# Patient Record
Sex: Female | Born: 2000 | Race: White | Hispanic: No | Marital: Single | Smoking: Never smoker
Health system: Southern US, Community
[De-identification: ages and names within clinical notes are randomized; demographics above are authoritative.]

## PROBLEM LIST (undated history)

## (undated) DIAGNOSIS — F329 Major depressive disorder, single episode, unspecified: Secondary | ICD-10-CM

## (undated) DIAGNOSIS — R45851 Suicidal ideations: Secondary | ICD-10-CM

## (undated) DIAGNOSIS — F909 Attention-deficit hyperactivity disorder, unspecified type: Secondary | ICD-10-CM

## (undated) DIAGNOSIS — F32A Depression, unspecified: Secondary | ICD-10-CM

## (undated) DIAGNOSIS — F3181 Bipolar II disorder: Secondary | ICD-10-CM

## (undated) HISTORY — DX: Bipolar II disorder: F31.81

---
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
## 2018-04-25 ENCOUNTER — Ambulatory Visit (INDEPENDENT_AMBULATORY_CARE_PROVIDER_SITE_OTHER): Payer: Managed Care, Other (non HMO) | Admitting: Physician Assistant

## 2018-04-25 ENCOUNTER — Other Ambulatory Visit: Payer: Self-pay

## 2018-04-25 ENCOUNTER — Encounter: Payer: Self-pay | Admitting: Physician Assistant

## 2018-04-25 VITALS — BP 110/70 | HR 91 | Temp 99.0°F | Resp 18 | Ht 65.41 in | Wt 217.6 lb

## 2018-04-25 DIAGNOSIS — R5383 Other fatigue: Secondary | ICD-10-CM

## 2018-04-25 DIAGNOSIS — R7989 Other specified abnormal findings of blood chemistry: Secondary | ICD-10-CM

## 2018-04-25 NOTE — Progress Notes (Signed)
Cassandra Obrien  MRN: [id number] DOB: 2001/06/30  PCP: Morrell RiddleWeber, Sarabella Caprio L, PA-C  Chief Complaint  Patient presents with  . Thyroid Problem    discuss thyroid     Subjective:  Pt presents to clinic for concerns that she is has had increased fatigue without change in her daily habits or lifestyle.  She has tried caffeine and it is not helping.  TSH 4.760 on 5/31 with psychiatrist - checked due to medications and symptoms - he started her on Synrthroid 50mcg and she has taken it for the last week that she wants to discuss but this needs for her.  History is obtained by patient.  Review of Systems  Constitutional: Positive for fatigue.    Patient Active Problem List   Diagnosis Date Noted  . Bipolar disorder (HCC) 12/22/2014  . Obesity, Class II, BMI 35-39.9 11/27/2012    Current Outpatient Medications on File Prior to Visit  Medication Sig Dispense Refill  . ALPRAZolam (XANAX) 0.5 MG tablet TAKE 1 TABLET BY MOUTH TWICE A DAY AS NEEDED FOR ANXIETY    . escitalopram (LEXAPRO) 20 MG tablet Take 1 tablet (20 mg total) by mouth at bedtime. 30 tablet 5  . levonorgestrel (MIRENA, 52 MG,) 20 MCG/24HR IUD by Intrauterine route.    Marland Kitchen. levothyroxine (SYNTHROID, LEVOTHROID) 50 MCG tablet TAKE ONE TABLET BY MOUTH DAILY ON EMPTY STOMACH  3  . ARIPiprazole (ABILIFY) 20 MG tablet Take 20 mg by mouth daily.  1  . lamoTRIgine (LAMICTAL) 150 MG tablet TAKE 2 TABLETS BY MOUTH DAILY. IF OUT FOR MORE THAN 1 WEEK, DON'T RESTART AND CALL MD  1  . lithium carbonate (LITHOBID) 300 MG CR tablet TAKE 2 TABLETS BY MOUTH AT BEDTIME FOR 4 DAYS, THEN 3 TABLETS BY MOUTH AT BEDTIME  1   No current facility-administered medications on file prior to visit.     Allergies  Allergen Reactions  . Prednisone Other (See Comments)    Trouble walking     Past Medical History:  Diagnosis Date  . ADHD (attention deficit hyperactivity disorder)   . Bipolar 2 disorder (HCC)   . Depression   . Suicidal ideations     Social History   Social History Narrative   Lives with her maternal grandparents and her mother in [place], [place]   Her younger half-siblings live with her father and step-mother in [place], [place].   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   Social History   Tobacco Use  . Smoking status: Never Smoker  . Smokeless tobacco: Never Used  Substance Use Topics  . Alcohol use: No    Alcohol/week: 0.0 oz  . Drug use: No   family history includes ADD / ADHD in her father; Bipolar disorder in her mother; Diabetes in her maternal grandmother; Heart disease in her maternal grandfather.     Objective:  BP 110/70   Pulse 91   Temp 99 F (37.2 C) (Oral)   Resp 18   Ht 5' 5.41" (1.661 m)   Wt 217 lb 9.6 oz (98.7 kg)   SpO2 98%   BMI 35.76 kg/m  Body mass index is 35.76 kg/m.  Wt Readings from Last 3 Encounters:  04/25/18 217 lb 9.6 oz (98.7 kg) (99 %, Z= 2.18)*  03/30/17 218 lb (98.9 kg) (99 %, Z= 2.19)*  02/28/17 220 lb (99.8 kg) (99 %,  Z= 2.21)*   * Growth percentiles are based on CDC (Girls, 2-20 Years) data.    Physical Exam  Constitutional: She is oriented to person, place, and time. She appears well-developed and well-nourished.  HENT:  Head: Normocephalic and atraumatic.  Right Ear: Hearing and external ear normal.  Left Ear: Hearing and external ear normal.  Eyes: Conjunctivae are normal.  Neck: Normal range of motion. No thyroid mass and no thyromegaly present.  Cardiovascular: Normal rate, regular rhythm and normal heart sounds.  No murmur heard. Pulmonary/Chest: Effort normal and breath sounds normal. She has no wheezes.  Neurological: She is alert and oriented to person, place, and time.  Skin: Skin is warm and dry.  Psychiatric: Judgment normal.  Vitals reviewed.   Assessment and Plan :  Elevated TSH - Plan: T4, Free patient has been  on increase of Synthroid but still want to do a free T4 as this should not be affected.  Unsure if her mildly elevated TSH is related to hypothyroidism or perhaps a medication interaction.  Discussed this with patient.  Patient verbalized to me that they understand the following: diagnosis, what is being done for them, what to expect and what should be done at home.  Their questions have been answered.  See after visit summary for patient specific instructions.  Benny Lennert PA-C  Primary Care at [facility] 04/25/2018 2:53 PM  Please note: Portions of this report may have been transcribed using dragon voice recognition software. Every effort was made to ensure accuracy; however, inadvertent computerized transcription errors may be present.

## 2018-04-25 NOTE — Patient Instructions (Signed)
     IF you received an x-ray today, you will receive an invoice from [facility]. Please contact [facility] at [phone] with questions or concerns regarding your invoice.   IF you received labwork today, you will receive an invoice from [facility]. Please contact [facility] at [phone] with questions or concerns regarding your invoice.   Our billing staff will not be able to assist you with questions regarding bills from these companies.  You will be contacted with the lab results as soon as they are available. The fastest way to get your results is to activate your My Chart account. Instructions are located on the last page of this paperwork. If you have not heard from us regarding the results in 2 weeks, please contact this office.     

## 2018-04-26 LAB — T4, FREE: FREE T4: 1.44 ng/dL (ref 0.93–1.60)

## 2018-05-31 ENCOUNTER — Encounter: Payer: Self-pay | Admitting: Physician Assistant

## 2018-05-31 ENCOUNTER — Other Ambulatory Visit: Payer: Self-pay

## 2018-05-31 ENCOUNTER — Ambulatory Visit (INDEPENDENT_AMBULATORY_CARE_PROVIDER_SITE_OTHER): Payer: Managed Care, Other (non HMO) | Admitting: Physician Assistant

## 2018-05-31 VITALS — BP 114/70 | HR 79 | Temp 98.6°F | Resp 20 | Ht 65.63 in | Wt 221.0 lb

## 2018-05-31 DIAGNOSIS — R000 Tachycardia, unspecified: Secondary | ICD-10-CM | POA: Diagnosis not present

## 2018-05-31 DIAGNOSIS — R7989 Other specified abnormal findings of blood chemistry: Secondary | ICD-10-CM | POA: Diagnosis not present

## 2018-05-31 NOTE — Progress Notes (Signed)
Cassandra MoulderMolly Obrien  MRN: [id number] DOB: September 23, 2001  PCP: Morrell RiddleWeber, Sarah L, PA-C  Chief Complaint  Patient presents with  . Medcation Follow Up/Labs     synthroid  . Irregular Heart Beat    pt states about 3 days    Subjective:  Pt presents to clinic for check on her thyroid.  She has been on synthroid 50mcg for about 6 weeks that was started with her psychiatrist due to low TSH and fatigue.  She started with a manic episode a week or so ago and started to feel bad and that was when she noticed her heart rate being fast or slow but not irregular.  The mania has almost resolved at this time.  She is working with a psychiatrist for her mood and it is mostly regulated.   She is very worried about her heart rate change.  She does not believe she has anxiety which leads to the elevated heart rate.  She states during the time that her heart rate is elevated she feels poorly.  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  She has had to use Xanax 2x/month due to mania and HR issues that were happening at the same time  History is obtained by patient.  Review of Systems  Constitutional: Negative for chills and fever.  HENT: Negative.   Cardiovascular: Positive for palpitations. Negative for leg swelling.  Psychiatric/Behavioral: Positive for dysphoric mood.    Patient Active Problem List   Diagnosis Date Noted  . Bipolar disorder (HCC) 12/22/2014  . MDD (major depressive disorder), recurrent severe, without psychosis (HCC) 12/22/2014  . Obesity, Class II, BMI 35-39.9 11/27/2012    Current Outpatient Medications on File Prior to Visit  Medication Sig Dispense Refill  . ALPRAZolam (XANAX) 0.5 MG tablet TAKE 1 TABLET BY MOUTH TWICE A DAY AS NEEDED FOR ANXIETY    . ARIPiprazole (ABILIFY) 20 MG tablet Take 20 mg by mouth daily.  1  . lamoTRIgine (LAMICTAL) 150 MG tablet TAKE 2 TABLETS BY MOUTH DAILY. IF OUT FOR MORE THAN 1 WEEK, DON'T  RESTART AND CALL MD  1  . levonorgestrel (MIRENA, 52 MG,) 20 MCG/24HR IUD by Intrauterine route.    Marland Kitchen. levothyroxine (SYNTHROID, LEVOTHROID) 50 MCG tablet TAKE ONE TABLET BY MOUTH DAILY ON EMPTY STOMACH  3  . lithium carbonate (LITHOBID) 300 MG CR tablet TAKE 2 TABLETS BY MOUTH AT BEDTIME FOR 4 DAYS, THEN 3 TABLETS BY MOUTH AT BEDTIME  1   No current facility-administered medications on file prior to visit.     Allergies  Allergen Reactions  . Prednisone Other (See Comments)    Trouble walking     Past Medical History:  Diagnosis Date  . ADHD (attention deficit hyperactivity disorder)   . Bipolar 2 disorder (HCC)   . Depression   . Suicidal ideations    Social History   Social History Narrative   Lives with her maternal grandparents and her mother in [place], [place]).   Her younger half-siblings live with her father and step-mother in [place], [place].   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   Social History   Tobacco Use  . Smoking status: Never Smoker  . Smokeless tobacco: Never Used  Substance Use Topics  . Alcohol use: No    Alcohol/week:  0.0 standard drinks  . Drug use: No   family history includes ADD / ADHD in her father; Bipolar disorder in her mother; Diabetes in her maternal grandmother; Heart disease in her maternal grandfather.     Objective:  BP 114/70 (BP Location: Right Arm, Patient Position: Sitting, Cuff Size: Large)   Pulse 79   Temp 98.6 F (37 C) (Oral)   Resp 20   Ht 5' 5.63" (1.667 m)   Wt 221 lb (100.2 kg)   SpO2 97%   BMI 36.07 kg/m  Body mass index is 36.07 kg/m.  Wt Readings from Last 3 Encounters:  05/31/18 221 lb (100.2 kg) (99 %, Z= 2.21)*  04/25/18 217 lb 9.6 oz (98.7 kg) (99 %, Z= 2.18)*  03/30/17 218 lb (98.9 kg) (99 %, Z= 2.19)*   * Growth percentiles are based on CDC (Girls, 2-20 Years) data.     Physical Exam  Constitutional: She is oriented to person, place, and time. She appears well-developed and well-nourished.  HENT:  Head: Normocephalic and atraumatic.  Right Ear: Hearing and external ear normal.  Left Ear: Hearing and external ear normal.  Eyes: Conjunctivae are normal.  Neck: Normal range of motion.  Cardiovascular: Normal rate, regular rhythm and normal heart sounds.  No murmur heard. Pulmonary/Chest: Effort normal and breath sounds normal. She has no wheezes.  Neurological: She is alert and oriented to person, place, and time.  Skin: Skin is warm and dry.  Psychiatric: Her behavior is normal. Judgment and thought content normal.  Mild anxiety, seems more frustrated.  Vitals reviewed.    Rhythm: sinus rythym at a rate of 73. Findings: NSR without acute changes Last EKG: none  Changes from last EKG: n/a I have personally reviewed the EKG tracing and agree with the computerized printout.   Assessment and Plan :  Tachycardia - Plan: EKG 12-Lead, Basic metabolic panel  Elevated TSH - Plan: TSH, T4, Free -  patient has been on Synthroid 50 mg for about 6 weeks.  Recheck labs today concerned that patient may be hyperthyroid due to medication dose causing her symptoms.  If her TSH is normal consider referral to cardiology for possible Holter monitor.  Patient verbalized to me that they understand the following: diagnosis, what is being done for them, what to expect and what should be done at home.  Their questions have been answered.  See after visit summary for patient specific instructions.  Benny LennertSarah Weber PA-C  Primary Care at [facility] 06/01/2018 6:35 PM  Please note: Portions of this report may have been transcribed using dragon voice recognition software. Every effort was made to ensure accuracy; however, inadvertent computerized transcription errors may be present.

## 2018-05-31 NOTE — Patient Instructions (Signed)
  I will contact you with your lab results within the next 2 weeks.  If you have not heard from us then please contact us. The fastest way to get your results is to register for My Chart.   IF you received an x-ray today, you will receive an invoice from [facility]. Please contact [facility] at [phone] with questions or concerns regarding your invoice.   IF you received labwork today, you will receive an invoice from [facility]. Please contact [facility] at [phone] with questions or concerns regarding your invoice.   Our billing staff will not be able to assist you with questions regarding bills from these companies.  You will be contacted with the lab results as soon as they are available. The fastest way to get your results is to activate your My Chart account. Instructions are located on the last page of this paperwork. If you have not heard from us regarding the results in 2 weeks, please contact this office.     

## 2018-06-01 ENCOUNTER — Encounter: Payer: Self-pay | Admitting: Physician Assistant

## 2018-06-01 LAB — T4, FREE: Free T4: 1.3 ng/dL (ref 0.93–1.60)

## 2018-06-01 LAB — BASIC METABOLIC PANEL
BUN / CREAT RATIO: 12 (ref 9–23)
BUN: 8 mg/dL (ref 6–20)
CO2: 25 mmol/L (ref 20–29)
CREATININE: 0.68 mg/dL (ref 0.57–1.00)
Calcium: 9.4 mg/dL (ref 8.7–10.2)
Chloride: 101 mmol/L (ref 96–106)
GFR calc Af Amer: 147 mL/min/{1.73_m2} (ref >59)
GFR, EST NON AFRICAN AMERICAN: 127 mL/min/{1.73_m2} (ref >59)
Glucose: 83 mg/dL (ref 65–99)
Potassium: 4.8 mmol/L (ref 3.5–5.2)
SODIUM: 140 mmol/L (ref 134–144)

## 2018-06-01 LAB — TSH: TSH: 1.02 u[IU]/mL (ref 0.450–4.500)

## 2018-06-03 ENCOUNTER — Encounter: Payer: Self-pay | Admitting: Physician Assistant

## 2018-06-03 DIAGNOSIS — R000 Tachycardia, unspecified: Secondary | ICD-10-CM

## 2018-06-07 ENCOUNTER — Encounter: Payer: Self-pay | Admitting: Family Medicine

## 2018-06-15 ENCOUNTER — Ambulatory Visit: Payer: Managed Care, Other (non HMO) | Admitting: Physician Assistant

## 2018-06-21 ENCOUNTER — Telehealth: Payer: Self-pay | Admitting: Family Medicine

## 2018-06-21 NOTE — Telephone Encounter (Signed)
Left a VM in regards to her appt she has with Dr. Clelia Croft on 07/02/2018. The provider is taking a leave of absence for the month of Sept. We can reschedule with a different provider or schedule with Dr. Clelia Croft in Oct.

## 2018-06-22 ENCOUNTER — Ambulatory Visit: Payer: Managed Care, Other (non HMO) | Admitting: Physician Assistant

## 2018-07-02 ENCOUNTER — Ambulatory Visit: Payer: Managed Care, Other (non HMO) | Admitting: Family Medicine

## 2018-08-02 ENCOUNTER — Encounter: Payer: Self-pay | Admitting: Physician Assistant

## 2018-08-02 ENCOUNTER — Other Ambulatory Visit: Payer: Self-pay

## 2018-08-02 ENCOUNTER — Ambulatory Visit (INDEPENDENT_AMBULATORY_CARE_PROVIDER_SITE_OTHER): Payer: Managed Care, Other (non HMO) | Admitting: Physician Assistant

## 2018-08-02 VITALS — BP 121/74 | HR 91 | Temp 98.2°F | Resp 16 | Ht 64.57 in | Wt 219.0 lb

## 2018-08-02 DIAGNOSIS — M2550 Pain in unspecified joint: Secondary | ICD-10-CM | POA: Diagnosis not present

## 2018-08-02 DIAGNOSIS — R5383 Other fatigue: Secondary | ICD-10-CM

## 2018-08-02 DIAGNOSIS — Z23 Encounter for immunization: Secondary | ICD-10-CM | POA: Diagnosis not present

## 2018-08-02 DIAGNOSIS — L659 Nonscarring hair loss, unspecified: Secondary | ICD-10-CM | POA: Diagnosis not present

## 2018-08-02 DIAGNOSIS — F3175 Bipolar disorder, in partial remission, most recent episode depressed: Secondary | ICD-10-CM

## 2018-08-02 DIAGNOSIS — R000 Tachycardia, unspecified: Secondary | ICD-10-CM | POA: Diagnosis not present

## 2018-08-02 NOTE — Patient Instructions (Addendum)
We have collected labs today and should have those results back within the next week.  In the meantime, I suggest changing to a more natural healthy diet.  Increasing water to at least 64 ounces a day.  Making sure you are taking enough time to relax throughout the week.  If you have lab work done today you will be contacted with your lab results within the next 2 weeks.  If you have not heard from [place] then please contact us. The fastest way to get your results is to register for My Chart.  Eating Healthy on a Budget There are many ways to save money at the grocery store and continue to eat healthy. You can be successful if you plan your meals according to your budget, purchase according to your budget and grocery list, and prepare food yourself. How can I buy more food on a limited budget? Plan  Plan meals and snacks according to a grocery list and budget you create.  Look for recipes where you can cook once and make enough food for two meals.  Include meals that will "stretch" more expensive foods such as stews, casseroles, and stir-fry dishes.  Make a grocery list and make sure to bring it with you to the store. If you have a smart phone, you could use your phone to create your shopping list. Purchase  When grocery shopping, buy only the items on your grocery list and go only to the areas of the store that have the items on your list. Prepare  Some meal items can be prepared in advance. Pre-cook on days when you have extra time.  Make extra food (such as by doubling recipes) and freeze the extras in meal-sized containers or in individual portions for fast meals and snacks.  Use leftovers in your meal plan for the week.  Try some meatless meals or try "no cook" meals like salads.  When you come home from the grocery store, wash and prepare your fruits and vegetables so they are ready to use and eat. This will help reduce food waste. How can I buy more food on a limited budget? Try  these tips the next time you go shopping:  [place] store brands or generic brands.  Use coupons only for foods and brands you normally buy. Avoid buying items you wouldn't normally buy simply because they are on sale.  Check online and in newspapers for weekly deals.  Buy healthy items from the bulk bins when available, such as herbs, spices, flours, pastas, nuts, and dried fruit.  Buy fruits and vegetables that are in season. Prices are usually lower on in-season produce.  Compare and contrast different items. You can do this by looking at the unit price on the price tag. Use it to compare different brands and sizes to find out which item is the best deal.  Choose naturally low-cost healthy items, such as carrots, potatoes, apples, bananas, and oranges. Dried or canned beans are a low-cost protein source.  Buy in bulk and freeze extra food. Items you can buy in bulk include meats, fish, poultry, frozen fruits, and frozen vegetables.  Limit the purchase of prepared or "ready-to-eat" foods, such as pre-cut fruits and vegetables and pre-made salads.  If possible, shop around to discover which grocery store offers the best prices. Some stores charge much more than other stores for the same items.  Do not shop when you are hungry. If you shop while hungry, It may be hard to stick  to your list and budget.  Stick to your list and resist impulse buys. Treat your list as your official plan for the week.  Buy a variety of vegetables and fruit by purchasing fresh, frozen, and canned items.  Look beyond eye level. Foods at eye level (adult or child eye level) are more expensive. Look at the top and bottom shelves for deals.  Be efficient with your time when shopping. The more time you spend at the store, the more money you are likely to spend.  Consider other retailers such as dollar stores, larger [employer], local fruit and vegetable stands, and farmers markets.  What are some tips for  less expensive food substitutions? When choosing more expensive foods like meats and dairy, try these tips to save money:  Choose cheaper cuts of meat, such as bone-in chicken thighs and drumsticks instead skinless and boneless chicken. When you are ready to prepare the chicken, you can remove the skin yourself to make it healthier.  Choose lean meats like chicken or [place]. When choosing ground beef, make sure it is lean ground beef (92% lean, 8% fat). If you do buy a fattier ground beef, drain the fat before eating.  Buy dried beans and peas, such as lentils, split peas, or kidney beans.  For seafood, choose canned tuna, salmon, or sardines.  Eggs are a low-cost source of protein.  Buy the larger tubs of yogurt instead of individual-sized containers.  Choose water instead of sodas and other sweetened beverages.  Skip buying chips, cookies, and other "junk food". These items are usually expensive, high in calories, and low in nutritional value.  How can I prepare the foods I buy in the healthiest way? Practice these tips for cooking foods in the healthiest way to reduce excess fat and calorie intake:  Steam, saute, grill, or bake foods instead of frying them.  Make sure half your plate is filled with fruits or vegetables. Choose from fresh, frozen, or canned fruits and vegetables. If eating canned, remember to rinse them before eating. This will remove any excess salt added for packaging.  Trim all fat from meat before cooking. Remove the skin from chicken or [place].  Spoon off fat from meat dishes once they have been chilled in the refrigerator and the fat has hardened on the top.  Use skim milk, low-fat milk, or evaporated skim milk when making cream sauces, soups, or puddings.  Substitute low-fat yogurt, sour cream, or cottage cheese for sour cream and mayonnaise in dips and dressings.  Try lemon juice, herbs, or spices to season food instead of salt, butter, or  margarine.  This information is not intended to replace advice given to you by your health care provider. Make sure you discuss any questions you have with your health care provider. Document Released: 06/06/2014 Document Revised: 04/22/2016 Document Reviewed: 05/06/2014 Elsevier Interactive Patient Education  2018 [employer].   IF you received an x-ray today, you will receive an invoice from [facility]. Please contact [facility] at [phone] with questions or concerns regarding your invoice.   IF you received labwork today, you will receive an invoice from [facility]. Please contact [facility] at [phone] with questions or concerns regarding your invoice.   Our billing staff will not be able to assist you with questions regarding bills from these companies.  You will be contacted with the lab results as soon as they are available. The fastest way to get your results is to activate your My Chart account.  Instructions are located on the last page of this paperwork. If you have not heard from [place] regarding the results in 2 weeks, please contact this office.

## 2018-08-02 NOTE — Progress Notes (Signed)
Cassandra Obrien  MRN: [id number] DOB: [birth year]  Subjective:  Cassandra Obrien is a 17 y.o. female seen in office today for a chief complaint of intermittent extreme fatigue, diffuse joint pain (hips, knees, shoulders), facial rash, hair loss, weight loss, and tachycardia for 2 months.  Has been evaluated for tachycardia in our office and referred to cardiology for Holter monitor.  Has not seen cardiology yet.  Timing of symptoms is variable.  Cannot identify any precipitating factors.  Denies joint swelling, redness, or warmth, chest pain, shortness of breath, abdominal pain, nausea, vomiting, constipation, diarrhea, heat/cold intolerance, polydipsia, polyphagia, dry brittle skin, visual disturbance.  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  She eats mostly fast food.  Drinks water occasionally.  Sleeps at least 8 hours per night.  No new medications or changes in environments.  Denies smoking, alcohol, and illicit drug use.  Has past medical history of hypothyroidism, was on Synthroid but last thyroid levels were normal so she stopped medication.  Also has past medical history of bipolar disorder, major depressive disorder, and obesity.  Followed by psychiatry every 6 months.  Has [name].  No family history of autoimmune disease.  Review of Systems  Constitutional: Negative for appetite change.  HENT: Negative for sinus pain and sore throat.   Respiratory: Negative for cough.   Neurological: Negative for speech difficulty and weakness.  Psychiatric/Behavioral: Negative for hallucinations.    Patient Active Problem List   Diagnosis Date Noted  . Bipolar disorder ([place]) 12/22/2014  . MDD (major depressive disorder), recurrent severe, without psychosis ([place]) 12/22/2014  . Obesity, Class II, BMI 35-39.9 11/27/2012    Current Outpatient Medications on File Prior to Visit  Medication Sig Dispense Refill  . ALPRAZolam (XANAX) 0.5 MG tablet TAKE 1 TABLET  BY MOUTH TWICE A DAY AS NEEDED FOR ANXIETY    . ARIPiprazole (ABILIFY) 20 MG tablet Take 20 mg by mouth daily.  1  . lamoTRIgine (LAMICTAL) 150 MG tablet TAKE 2 TABLETS BY MOUTH DAILY. IF OUT FOR MORE THAN 1 WEEK, DON'T RESTART AND CALL MD  1  . levonorgestrel (MIRENA, 52 MG,) 20 MCG/24HR IUD by Intrauterine route.    Marland Kitchen levothyroxine (SYNTHROID, LEVOTHROID) 50 MCG tablet TAKE ONE TABLET BY MOUTH DAILY ON EMPTY STOMACH  3  . lithium carbonate (LITHOBID) 300 MG CR tablet TAKE 2 TABLETS BY MOUTH AT BEDTIME FOR 4 DAYS, THEN 3 TABLETS BY MOUTH AT BEDTIME  1   No current facility-administered medications on file prior to visit.     Allergies  Allergen Reactions  . Prednisone Other (See Comments)    Trouble walking      Objective:  BP 121/74   Pulse 91   Temp 98.2 F (36.8 C) (Oral)   Resp 16   Ht 5' 4.57" (1.64 m)   Wt 219 lb (99.3 kg)   SpO2 96%   BMI 36.93 kg/m   Physical Exam  Constitutional: She is oriented to person, place, and time. She appears well-developed and well-nourished. No distress.  HENT:  Head: Normocephalic and atraumatic.  Mouth/Throat: Uvula is midline, oropharynx is clear and moist and mucous membranes are normal.  Eyes: Pupils are equal, round, and reactive to light. Conjunctivae and EOM are normal.  Neck: Normal range of motion. No thyroid mass and no thyromegaly present.  Cardiovascular: Normal rate, regular rhythm, normal heart sounds and intact distal pulses.  Pulmonary/Chest: Effort normal and breath sounds normal.  She has no wheezes. She has no rhonchi. She has no rales.  Abdominal: Normal appearance. There is no tenderness.  Musculoskeletal:       Right lower leg: She exhibits no swelling.       Left lower leg: She exhibits no swelling.  No joint pain, swelling, erythema, warmth palpated on all joints bilaterally.  No tender points noted.  Neurological: She is alert and oriented to person, place, and time.  Skin: Skin is warm and dry. No rash noted.   No obvious hair thinning noted on scalp exam.  Normal hair follicles present.  No scarring of the scalp. Negative hair pull test.  Psychiatric: She has a normal mood and affect.  Vitals reviewed.      Assessment and Plan :  1. Polyarthralgia Patient is overall well-appearing, no acute distress.  No acute findings noted on physical exam.  Vitals stable.  She is requesting a work-up for autoimmune disease.  Her symptom said is very vague.  Will collect basic labs and basic autoimmune labs at this time.  Patient reports a history of hypothyroidism, used to be on Synthroid 50 mcg.  Quit this recently.  Questioning of TSH is off.  Labs pending.  Otherwise, recommended eating less fast food and more healthy all-natural diet.  Increase water consumption.  Making time to rest throughout the week as she has a full load between school and working full-time.  Follow-up with cardiology for further evaluation of intermittent tachycardia. - CBC with Differential/Platelet - CMP14+EGFR - TSH - C-reactive protein - Rheumatoid factor - ANA - Cyclic Citrul Peptide Antibody, IGG - Care order/instruction:  2. Tachycardia   3. Hair thinning   4. Other fatigue   5. Bipolar disorder, in partial remission, most recent episode depressed ([place]) Has not had a lithium level checked about 6 months.  Will repeat today. - Lithium level  6. Need for prophylactic vaccination and inoculation against influenza - Flu Vaccine QUAD 36+ mos IM  Note - This record has been created using [employer].  Chart creation errors have been sought, but may not always  have been located. Such creation errors do not reflect on  the standard of medical care.   Tenna Delaine PA-C  [facility] 08/02/2018 1:59 PM

## 2018-08-03 LAB — CBC WITH DIFFERENTIAL/PLATELET
BASOS ABS: 0.1 10*3/uL (ref 0.0–0.2)
BASOS: 1 %
EOS (ABSOLUTE): 0.3 10*3/uL (ref 0.0–0.4)
Eos: 2 %
HEMOGLOBIN: 14.3 g/dL (ref 11.1–15.9)
Hematocrit: 42.6 % (ref 34.0–46.6)
Immature Grans (Abs): 0.1 10*3/uL (ref 0.0–0.1)
Immature Granulocytes: 1 %
LYMPHS ABS: 2.7 10*3/uL (ref 0.7–3.1)
Lymphs: 26 %
MCH: 28.1 pg (ref 26.6–33.0)
MCHC: 33.6 g/dL (ref 31.5–35.7)
MCV: 84 fL (ref 79–97)
MONOCYTES: 6 %
Monocytes Absolute: 0.7 10*3/uL (ref 0.1–0.9)
NEUTROS ABS: 6.9 10*3/uL (ref 1.4–7.0)
Neutrophils: 64 %
Platelets: 395 10*3/uL (ref 150–450)
RBC: 5.08 x10E6/uL (ref 3.77–5.28)
RDW: 13.3 % (ref 12.3–15.4)
WBC: 10.7 10*3/uL (ref 3.4–10.8)

## 2018-08-03 LAB — CMP14+EGFR
A/G RATIO: 1.6 (ref 1.2–2.2)
ALK PHOS: 100 IU/L (ref 39–117)
ALT: 17 IU/L (ref 0–32)
AST: 14 IU/L (ref 0–40)
Albumin: 4.6 g/dL (ref 3.5–5.5)
BILIRUBIN TOTAL: 0.3 mg/dL (ref 0.0–1.2)
BUN / CREAT RATIO: 11 (ref 9–23)
BUN: 7 mg/dL (ref 6–20)
CO2: 22 mmol/L (ref 20–29)
Calcium: 9.5 mg/dL (ref 8.7–10.2)
Chloride: 101 mmol/L (ref 96–106)
Creatinine, Ser: 0.64 mg/dL (ref 0.57–1.00)
GFR calc Af Amer: 150 mL/min/{1.73_m2} (ref >59)
GFR calc non Af Amer: 130 mL/min/{1.73_m2} (ref >59)
GLOBULIN, TOTAL: 2.9 g/dL (ref 1.5–4.5)
Glucose: 77 mg/dL (ref 65–99)
POTASSIUM: 4.1 mmol/L (ref 3.5–5.2)
SODIUM: 140 mmol/L (ref 134–144)
TOTAL PROTEIN: 7.5 g/dL (ref 6.0–8.5)

## 2018-08-03 LAB — RHEUMATOID FACTOR: Rhuematoid fact SerPl-aCnc: <10 IU/mL (ref 0.0–13.9)

## 2018-08-03 LAB — TSH: TSH: 1.33 u[IU]/mL (ref 0.450–4.500)

## 2018-08-03 LAB — ANA: Anti Nuclear Antibody(ANA): NEGATIVE

## 2018-08-03 LAB — C-REACTIVE PROTEIN: CRP: 16 mg/L — AB (ref 0–10)

## 2018-08-03 LAB — LITHIUM LEVEL: Lithium Lvl: 0.4 mmol/L — ABNORMAL LOW (ref 0.6–1.2)

## 2018-08-30 ENCOUNTER — Ambulatory Visit (INDEPENDENT_AMBULATORY_CARE_PROVIDER_SITE_OTHER): Payer: Managed Care, Other (non HMO) | Admitting: Family Medicine

## 2018-08-30 ENCOUNTER — Other Ambulatory Visit: Payer: Self-pay

## 2018-08-30 ENCOUNTER — Encounter: Payer: Self-pay | Admitting: Family Medicine

## 2018-08-30 VITALS — BP 115/74 | HR 98 | Temp 98.0°F | Resp 16 | Ht 64.0 in | Wt 215.0 lb

## 2018-08-30 DIAGNOSIS — M791 Myalgia, unspecified site: Secondary | ICD-10-CM

## 2018-08-30 DIAGNOSIS — M25551 Pain in right hip: Secondary | ICD-10-CM

## 2018-08-30 DIAGNOSIS — D649 Anemia, unspecified: Secondary | ICD-10-CM

## 2018-08-30 DIAGNOSIS — J0101 Acute recurrent maxillary sinusitis: Secondary | ICD-10-CM

## 2018-08-30 DIAGNOSIS — R000 Tachycardia, unspecified: Secondary | ICD-10-CM

## 2018-08-30 DIAGNOSIS — R7982 Elevated C-reactive protein (CRP): Secondary | ICD-10-CM

## 2018-08-30 DIAGNOSIS — M2550 Pain in unspecified joint: Secondary | ICD-10-CM

## 2018-08-30 DIAGNOSIS — J3489 Other specified disorders of nose and nasal sinuses: Secondary | ICD-10-CM | POA: Diagnosis not present

## 2018-08-30 DIAGNOSIS — M25552 Pain in left hip: Secondary | ICD-10-CM

## 2018-08-30 DIAGNOSIS — R7309 Other abnormal glucose: Secondary | ICD-10-CM

## 2018-08-30 LAB — POCT CBC
Granulocyte percent: 69 %G (ref 37–80)
HCT, POC: 43.5 % — AB (ref 29–41)
HEMOGLOBIN: 15 g/dL — AB (ref 9.5–13.5)
LYMPH, POC: 2.8 (ref 0.6–3.4)
MCH: 28.5 pg (ref 27–31.2)
MCHC: 34.4 g/dL (ref 31.8–35.4)
MCV: 83 fL (ref 76–111)
MID (CBC): 0.9 (ref 0–0.9)
MPV: 8.8 fL (ref 0–99.8)
PLATELET COUNT, POC: 333 10*3/uL (ref 142–424)
POC Granulocyte: 8.3 — AB (ref 2–6.9)
POC LYMPH PERCENT: 23.4 %L (ref 10–50)
POC MID %: 7.6 % (ref 0–12)
RBC: 5.25 M/uL (ref 4.04–5.48)
RDW, POC: 13.3 %
WBC: 12 10*3/uL — AB (ref 4.6–10.2)

## 2018-08-30 MED ORDER — AMOXICILLIN-POT CLAVULANATE 875-125 MG PO TABS: 1.0000 | ORAL_TABLET | Freq: Two times a day (BID) | ORAL | 0 refills | Status: DC

## 2018-08-30 MED ORDER — FLUTICASONE PROPIONATE 50 MCG/ACT NA SUSP: 2.0000 | Freq: Every day | NASAL | 2 refills | Status: DC

## 2018-08-30 NOTE — Progress Notes (Signed)
Subjective:    Patient: Cassandra Obrien  DOB: 01-28-2001; 17 y.o.   MRN: [id number]  Chief Complaint  Patient presents with  . Polyarthralgia    follow-up on labs done on 10/17    HPI Saw [name] 1 mo ago for labs but since August has had intermittent progression of sxs. Tachycardia, CP, SHoB since August and went to cardiologist 4d ago but feels like he didn't believe her. In that time she has also been having joint pain, severe fatigue, and hair falling out. Few other minor things like intermittent temperature instability.  Doesn't run fevers every - not even when she had appendicitis with high WBC.  Occ takes ibuprofen for the joint pain which helps intermittently depending on the severity of the pain.  Joint pain is mainly in hips, knees, and jaw (which is in mandibular region bilaterally - has seen dentist who said teeth are fine). Would occasionally call home from work with the spells which occur 2-3x/wk lasting for 20 min. HR has gotten up to 150.  She is scheduled to have an echocardiogram and a treadmill EKG.   Did respond to face in ice water.   Has been on same med regimen for psych for the past 1.5-2 yrs followed by Galen Manila NP with [facility] in [place] - no changes at all, even in dose.   She feels like she is getting sinusitis from increasing sinus pain/pressure in bilateral maxillary sinuses over the past week w/o gum or ear pain, sore throat, and non-productive cough. Has had multiple sick contacts.  Is producing yellow rhinitis.   Several years ago she was on prednisone and she was uncoordinated - walk like a drunk person, her sxs would progress throughout the day and then she would be fine the next day. She took it for 4 days.    Medical History Past Medical History:  Diagnosis Date  . ADHD (attention deficit hyperactivity disorder)   . Bipolar 2 disorder ([place])   . Depression   . Suicidal ideations    Past Surgical History:  Procedure Laterality Date    . APPENDECTOMY  02/2014  . LAPAROSCOPIC APPENDECTOMY N/A 02/14/2014   Procedure: APPENDECTOMY LAPAROSCOPIC;  Surgeon: Jerilynn Mages. Gerald Stabs, MD;  Location: [facility];  Service: Pediatrics;  Laterality: N/A;   Current Outpatient Medications on File Prior to Visit  Medication Sig Dispense Refill  . ALPRAZolam (XANAX) 0.5 MG tablet TAKE 1 TABLET BY MOUTH TWICE A DAY AS NEEDED FOR ANXIETY    . ARIPiprazole (ABILIFY) 20 MG tablet Take 20 mg by mouth daily.  1  . lamoTRIgine (LAMICTAL) 150 MG tablet TAKE 2 TABLETS BY MOUTH DAILY. IF OUT FOR MORE THAN 1 WEEK, DON'T RESTART AND CALL MD  1  . levonorgestrel (MIRENA, 52 MG,) 20 MCG/24HR IUD by Intrauterine route.    . lithium carbonate (LITHOBID) 300 MG CR tablet TAKE 2 TABLETS BY MOUTH AT BEDTIME FOR 4 DAYS, THEN 3 TABLETS BY MOUTH AT BEDTIME  1   No current facility-administered medications on file prior to visit.    Allergies  Allergen Reactions  . Prednisone Other (See Comments)    Trouble walking    Family History  Problem Relation Age of Onset  . Bipolar disorder Mother   . ADD / ADHD Father   . Diabetes Maternal Grandmother   . Heart disease Maternal Grandfather    Social History   Socioeconomic History  . Marital status: Single    Spouse name: n/a  .  Number of children: 0  . Years of education: Not on file  . Highest education level: Not on file  Occupational History  . Occupation: Ship broker  Social Needs  . Financial resource strain: Not on file  . Food insecurity:    Worry: Not on file    Inability: Not on file  . Transportation needs:    Medical: Not on file    Non-medical: Not on file  Tobacco Use  . Smoking status: Never Smoker  . Smokeless tobacco: Never Used  Substance and Sexual Activity  . Alcohol use: No    Alcohol/week: 0.0 standard drinks  . Drug use: No  . Sexual activity: Yes    Birth control/protection: IUD  Lifestyle  . Physical activity:    Days per week: Not on file    Minutes per session: Not on file  .  Stress: Not on file  Relationships  . Social connections:    Talks on phone: Not on file    Gets together: Not on file    Attends religious service: Not on file    Active member of club or organization: Not on file    Attends meetings of clubs or organizations: Not on file    Relationship status: Not on file  Other Topics Concern  . Not on file  Social History Narrative   Lives with her maternal grandparents and her mother in [place], [place]).   Her younger half-siblings live with her father and step-mother in [place], [place].   Graduated from [employer] - currently EMT - she is in Runner, broadcasting/film/video school to become a paramedic - works for [employer]   Her mother is a Audiological scientist.   Depression screen [facility] 2/9 08/30/2018 08/02/2018 04/25/2018 06/24/2016 02/23/2016  Decreased Interest 0 0 0 2 0  Down, Depressed, Hopeless 0 0 0 2 0  PHQ - 2 Score 0 0 0 4 0  Altered sleeping - - - 2 -  Tired, decreased energy - - - 2 -  Change in appetite - - - 2 -  Feeling bad or failure about yourself  - - - 2 -  Trouble concentrating - - - 2 -  Moving slowly or fidgety/restless - - - 2 -  Suicidal thoughts - - - 2 -  PHQ-9 Score - - - 18 -  Difficult doing work/chores - - - - -  Some encounter information is confidential and restricted. Go to Review Flowsheets activity to see all data.    ROS As noted in HPI  Objective:  BP 115/74   Pulse 98   Temp 98 F (36.7 C) (Oral)   Resp 16   Ht '5\' 4"'$  (1.626 m)   Wt 215 lb (97.5 kg)   SpO2 94%   BMI 36.90 kg/m  Physical Exam  Constitutional: She is oriented to person, place, and time. She appears well-developed and well-nourished. No distress.  HENT:  Head: Normocephalic and atraumatic.  Right Ear: External ear normal.  Left Ear: External ear normal.  Eyes: Conjunctivae are normal. No scleral icterus.  Neck: Normal range of motion. Neck supple. No thyromegaly present.  Cardiovascular: Normal rate, regular rhythm, normal heart sounds and  intact distal pulses.  Pulmonary/Chest: Effort normal and breath sounds normal. No respiratory distress.  Musculoskeletal: She exhibits no edema.  Lymphadenopathy:    She has no cervical adenopathy.  Neurological: She is alert and oriented to person, place, and time.  Skin: Skin is warm and dry. She is not diaphoretic.  No erythema.  Psychiatric: She has a normal mood and affect. Her behavior is normal.    POC TESTING No visits with results within 3 Day(s) from this visit.  Latest known visit with results is:  Office Visit on 08/02/2018  Component Date Value Ref Range Status  . WBC 08/02/2018 10.7  3.4 - 10.8 x10E3/uL Final  . RBC 08/02/2018 5.08  3.77 - 5.28 x10E6/uL Final  . Hemoglobin 08/02/2018 14.3  11.1 - 15.9 g/dL Final  . Hematocrit 08/02/2018 42.6  34.0 - 46.6 % Final  . MCV 08/02/2018 84  79 - 97 fL Final  . MCH 08/02/2018 28.1  26.6 - 33.0 pg Final  . MCHC 08/02/2018 33.6  31.5 - 35.7 g/dL Final  . RDW 08/02/2018 13.3  12.3 - 15.4 % Final  . Platelets 08/02/2018 395  150 - 450 x10E3/uL Final  . Neutrophils 08/02/2018 64  Not Estab. % Final  . Lymphs 08/02/2018 26  Not Estab. % Final  . Monocytes 08/02/2018 6  Not Estab. % Final  . Eos 08/02/2018 2  Not Estab. % Final  . Basos 08/02/2018 1  Not Estab. % Final  . Neutrophils Absolute 08/02/2018 6.9  1.4 - 7.0 x10E3/uL Final  . Lymphocytes Absolute 08/02/2018 2.7  0.7 - 3.1 x10E3/uL Final  . Monocytes Absolute 08/02/2018 0.7  0.1 - 0.9 x10E3/uL Final  . EOS (ABSOLUTE) 08/02/2018 0.3  0.0 - 0.4 x10E3/uL Final  . Basophils Absolute 08/02/2018 0.1  0.0 - 0.2 x10E3/uL Final  . Immature Granulocytes 08/02/2018 1  Not Estab. % Final  . Immature Grans (Abs) 08/02/2018 0.1  0.0 - 0.1 x10E3/uL Final  . Glucose 08/02/2018 77  65 - 99 mg/dL Final  . BUN 08/02/2018 7  6 - 20 mg/dL Final  . Creatinine, Ser 08/02/2018 0.64  0.57 - 1.00 mg/dL Final  . GFR calc non Af Amer 08/02/2018 130  >59 mL/min/1.73 Final  . GFR calc Af Amer  08/02/2018 150  >59 mL/min/1.73 Final  . BUN/Creatinine Ratio 08/02/2018 11  9 - 23 Final  . Sodium 08/02/2018 140  134 - 144 mmol/L Final  . Potassium 08/02/2018 4.1  3.5 - 5.2 mmol/L Final  . Chloride 08/02/2018 101  96 - 106 mmol/L Final  . CO2 08/02/2018 22  20 - 29 mmol/L Final  . Calcium 08/02/2018 9.5  8.7 - 10.2 mg/dL Final  . Total Protein 08/02/2018 7.5  6.0 - 8.5 g/dL Final  . Albumin 08/02/2018 4.6  3.5 - 5.5 g/dL Final  . Globulin, Total 08/02/2018 2.9  1.5 - 4.5 g/dL Final  . Albumin/Globulin Ratio 08/02/2018 1.6  1.2 - 2.2 Final  . Bilirubin Total 08/02/2018 0.3  0.0 - 1.2 mg/dL Final  . Alkaline Phosphatase 08/02/2018 100  39 - 117 IU/L Final  . AST 08/02/2018 14  0 - 40 IU/L Final  . ALT 08/02/2018 17  0 - 32 IU/L Final  . TSH 08/02/2018 1.330  0.450 - 4.500 uIU/mL Final  . CRP 08/02/2018 16* 0 - 10 mg/L Final  . Rhuematoid fact SerPl-aCnc 08/02/2018 <10.0  0.0 - 13.9 IU/mL Final  . Anti Nuclear Antibody(ANA) 08/02/2018 Negative  Negative Final  . Lithium Lvl 08/02/2018 0.4* 0.6 - 1.2 mmol/L Final   Comment:                                  Detection Limit = 0.1                           <  0.1 indicates None Detected      Assessment & Plan:   1. Sinus pressure   2. Acute recurrent maxillary sinusitis   3. Pain of both hip joints - pt w/ many systemic sxs, RTC for additional labs (ESR/CRP, etc) when not acutley ill - rec ~3-4 wks.  4. Tachycardia with heart rate 141-160 beats per minute - has echo and treadmill stress sched w/ card - f/u after and can proceed w/ event monitor if sxs cont, try vagal maneuvers if whenever sxs return to see if can breast c/w PSVT but try prn propranolol 10 if vagal manuvers ineffective. May do well on low dose of toprol after w/u     Patient will continue on current chronic medications other than changes noted above, so ok to refill when needed.   See after visit summary for patient specific instructions.  Orders Placed This  Encounter  Procedures  . POCT CBC    Meds ordered this encounter  Medications  . amoxicillin-clavulanate (AUGMENTIN) 875-125 MG tablet    Sig: Take 1 tablet by mouth 2 (two) times daily.    Dispense:  14 tablet    Refill:  0  . fluticasone (FLONASE) 50 MCG/ACT nasal spray    Sig: Place 2 sprays into both nostrils at bedtime.    Dispense:  16 g    Refill:  2  . propranolol (INDERAL) 10 MG tablet    Sig: Take 1-2 tablets po prn tachycardia/palpitations. May repeat after 2 hours if sxs persist/recur    Dispense:  60 tablet    Refill:  0    Patient verbalized to me that they understand the following: diagnosis, what is being done for them, what to expect and what should be done at home.  Their questions have been answered. They understand that I am unable to predict every possible medication interaction or adverse outcome and that if any unexpected symptoms arise, they should contact us and their pharmacist, as well as never hesitate to seek urgent/emergent care at [facility] or ER if they think it might be warranted.    Delman Cheadle, MD, MPH [facility] at [facility] [place], [place]  [address] [phone] Office phone  [phone] Office fax  08/30/18 12:17 PM   Over 40 min spent in face-to-face evaluation of and consultation with patient and coordination of care.  Over 50% of this time was spent counseling this patient regarding potential etiology and w/u of sytstemic sxs and new onset severe knee and hip arthralgias and episodic tachycardia - reviweed sxs of PSVT which I suspect is cause.

## 2018-08-30 NOTE — Patient Instructions (Addendum)
If you have lab work done today you will be contacted with your lab results within the next 2 weeks.  If you have not heard from [place] then please contact us. The fastest way to get your results is to register for My Chart.   IF you received an x-ray today, you will receive an invoice from [facility]. Please contact [facility] at [phone] with questions or concerns regarding your invoice.   IF you received labwork today, you will receive an invoice from [facility]. Please contact [facility] at [phone] with questions or concerns regarding your invoice.   Our billing staff will not be able to assist you with questions regarding bills from these companies.  You will be contacted with the lab results as soon as they are available. The fastest way to get your results is to activate your My Chart account. Instructions are located on the last page of this paperwork. If you have not heard from [place] regarding the results in 2 weeks, please contact this office.     Supraventricular Tachycardia, Adult Supraventricular tachycardia (SVT) is a type of abnormal heart rhythm. It causes the heart to beat very quickly and then return to a normal speed. A normal heart rate is 60-100 beats per minute. During an episode of SVT, your heart rate may be higher than 150 beats per minute. Episodes of SVT can be frightening, but they are usually not dangerous. However, if episodes happens often or last for long periods of time, they may lead to heart failure. What are the causes? Usually, a normal heartbeat starts when an area called the sinoatrial node releases an electrical signal. In SVT, other areas of the heart send out electrical signals that interfere with the signal from the sinoatrial node. It is not known why some people get SVT and others do not. What increases the risk? This condition is more likely to develop in:  People who are 19 years old.  Women.  Factors that may  increase your chances of an attack include:  Stress.  Tiredness.  Smoking.  Stimulant drugs, such as cocaine and methamphetamine.  Alcohol.  Caffeine.  Pregnancy.  Anxiety.  What are the signs or symptoms? Symptoms of this condition include:  A pounding heart.  A feeling that the heart is skipping beats (palpitations).  Weakness.  Shortness of breath.  Tightness or pain in your chest.  Light-headedness.  Anxiety.  Dizziness.  Sweating.  Nausea.  Fainting.  Fatigue or tiredness.  A mild episode may not cause symptoms. How is this diagnosed? This condition may be diagnosed based on:  Your symptoms.  A physical exam. If you are have an episode of SVT during the exam, the health care provider may be able to diagnose SVT by listening to your heart and feeling your pulse.  Tests. These may include: ? An electrocardiogram (ECG). This test is done to check for problems with electrical activity in the heart. ? A Holter monitor or event monitor test. This test involves wearing a portable device that monitors your heart rate over time. ? An echocardiogram. This test involves taking an image of your heart using sound waves. It is done to rule out other causes of a fast heart rate. ? Blood tests.  How is this treated? This condition may be treated with:  Vagal nerve stimulation. The treatment involves stimulating your vagus nerve, which slows down the heart. It is often the first and only treatment that is needed for this  condition. It is a good idea to try the several ways of doing vagal stimulation to find which one works best for you. Ways to do this treatment include: ? Holding your breath and pushing, as though you are having a bowel movement. ? Massaging an area on one side of your neck, below your jaw. Do not try this yourself. Only a health care provider should do this. If done the wrong way, it can lead to a stroke. ? Bending forward with your head between  your legs. ? Coughing while bending forward with your head between your legs. ? Closing your eyes and massaging your eyeballs. A health care provider should guide you through this method before you try it on your own.  Medicines that prevent attacks.  Medicine to stop an attack. The medicine is given through an IV tube at the hospital.  A small electric shock (cardioversion) that stops an attack. Before you get the shock, you will get medicine to make you fall asleep.  Radiofrequency ablation. In this procedure, a small, thin tube (catheter) is used to send radiofrequency energy to the area of tissue that is causing the rapid heartbeats. The energy kills the cells and helps your heart keep a normal rhythm. You may have this treatment if you have symptoms of SVT often.  If you do not have symptoms, you may not need treatment. Follow these instructions at home: Stress  Avoid stressful situations when possible.  Find healthy ways of managing stress that work for you. Some healthy ways to manage stress include: ? Taking part in relaxing activities, such as yoga, meditation, or being out in nature. ? Listening to relaxing music. ? Practicing relaxation techniques, such as deep breathing. ? Leading a healthy lifestyle. This involves getting plenty of sleep, exercising, and eating a balanced diet. ? Attending counseling or talk therapy with a mental health professional. Sleep  Try to get at least 7 hours of sleep each night. Tobacco and nicotine  Do not use any products that contain nicotine or tobacco, such as cigarettes and e-cigarettes. If you need help quitting, ask your health care provider. Alcohol  If alcohol triggers episodes of SVT, do not drink alcohol.  If alcohol does not seem to trigger episodes, limit alcohol intake to no more than 1 drink a day for nonpregnant women and 2 drinks a day for men. One drink equals 12 oz of beer, 5 oz of wine, or 1 oz of hard  liquor. Caffeine  If caffeine triggers episodes of SVT, do not eat, drink, or use anything with caffeine in it.  If caffeine does not seem to trigger episodes, consume caffeine in moderation. Stimulant drugs  Do not use stimulant drugs. If you need help quitting, talk with your health care provider. General instructions  Maintain a healthy weight.  Exercise regularly. Ask your health care provider to suggest some good activities for you. Aim for one or a combination of the following: ? 150 minutes per week of moderate exercise, such as walking or yoga. ? 75 minutes per week of vigorous exercise, such as running or swimming.  Perform vagus nerve stimulation as directed by your health care provider.  Take over-the-counter and prescription medicines only as told by your health care provider. Contact a health care provider if:  You have episodes of SVT more often than before.  Episodes of SVT last longer than before.  Vagus nerve stimulation is no longer helping.  You have new symptoms. Get help right away  if:  You have chest pain.  Your symptoms get worse.  You have trouble breathing.  You have an episode of SVT that lasts longer than 20 minutes.  You faint. These symptoms may represent a serious problem that is an emergency. Do not wait to see if the symptoms will go away. Get medical help right away. Call your local emergency services (911 in the [place] Do not drive yourself to the hospital. This information is not intended to replace advice given to you by your health care provider. Make sure you discuss any questions you have with your health care provider. Document Released: 10/03/2005 Document Revised: 06/09/2016 Document Reviewed: 06/09/2016 Elsevier Interactive Patient Education  2018 [employer].  Arthritis Arthritis is a term that is commonly used to refer to joint pain or joint disease. There are more than 100 types of arthritis. What are the causes? The most  common cause of this condition is wear and tear of a joint. Other causes include:  Gout.  Inflammation of a joint.  An infection of a joint.  Sprains and other injuries near the joint.  A drug reaction or allergic reaction.  In some cases, the cause may not be known. What are the signs or symptoms? The main symptom of this condition is pain in the joint with movement. Other symptoms include:  Redness, swelling, or stiffness at a joint.  Warmth coming from the joint.  Fever.  Overall feeling of illness.  How is this diagnosed? This condition may be diagnosed with a physical exam and tests, including:  Blood tests.  Urine tests.  Imaging tests, such as MRI, X-rays, or a CT scan.  Sometimes, fluid is removed from a joint for testing. How is this treated? Treatment for this condition may involve:  Treatment of the cause, if it is known.  Rest.  Raising (elevating) the joint.  Applying cold or hot packs to the joint.  Medicines to improve symptoms and reduce inflammation.  Injections of a steroid such as cortisone into the joint to help reduce pain and inflammation.  Depending on the cause of your arthritis, you may need to make lifestyle changes to reduce stress on your joint. These changes may include exercising more and losing weight. Follow these instructions at home: Medicines  Take over-the-counter and prescription medicines only as told by your health care provider.  Do not take aspirin to relieve pain if gout is suspected. Activity  Rest your joint if told by your health care provider. Rest is important when your disease is active and your joint feels painful, swollen, or stiff.  Avoid activities that make the pain worse. It is important to balance activity with rest.  Exercise your joint regularly with range-of-motion exercises as told by your health care provider. Try doing low-impact exercise, such as: ? Swimming. ? Water  aerobics. ? Biking. ? Walking. Joint Care   If your joint is swollen, keep it elevated if told by your health care provider.  If your joint feels stiff in the morning, try taking a warm shower.  If directed, apply heat to the joint. If you have diabetes, do not apply heat without permission from your health care provider. ? Put a towel between the joint and the hot pack or heating pad. ? Leave the heat on the area for 20-30 minutes.  If directed, apply ice to the joint: ? Put ice in a plastic bag. ? Place a towel between your skin and the bag. ? Leave the ice  on for 20 minutes, 2-3 times per day.  Keep all follow-up visits as told by your health care provider. This is important. Contact a health care provider if:  The pain gets worse.  You have a fever. Get help right away if:  You develop severe joint pain, swelling, or redness.  Many joints become painful and swollen.  You develop severe back pain.  You develop severe weakness in your leg.  You cannot control your bladder or bowels. This information is not intended to replace advice given to you by your health care provider. Make sure you discuss any questions you have with your health care provider. Document Released: 11/10/2004 Document Revised: 03/10/2016 Document Reviewed: 12/29/2014 Elsevier Interactive Patient Education  [employer].

## 2018-08-31 MED ORDER — PROPRANOLOL HCL 10 MG PO TABS: ORAL_TABLET | ORAL | 0 refills | Status: DC

## 2018-09-11 ENCOUNTER — Ambulatory Visit (INDEPENDENT_AMBULATORY_CARE_PROVIDER_SITE_OTHER): Payer: Managed Care, Other (non HMO) | Admitting: Family Medicine

## 2018-09-11 DIAGNOSIS — M25551 Pain in right hip: Secondary | ICD-10-CM

## 2018-09-11 DIAGNOSIS — M25552 Pain in left hip: Secondary | ICD-10-CM

## 2018-09-11 DIAGNOSIS — R7309 Other abnormal glucose: Secondary | ICD-10-CM

## 2018-09-11 DIAGNOSIS — D649 Anemia, unspecified: Secondary | ICD-10-CM

## 2018-09-11 DIAGNOSIS — R7982 Elevated C-reactive protein (CRP): Secondary | ICD-10-CM

## 2018-09-11 DIAGNOSIS — M2550 Pain in unspecified joint: Secondary | ICD-10-CM

## 2018-09-11 DIAGNOSIS — M791 Myalgia, unspecified site: Secondary | ICD-10-CM

## 2018-09-11 NOTE — Addendum Note (Signed)
Addended by: Sherren MochaSHAW, EVA N on: 09/11/2018 02:07 PM   Modules accepted: Orders

## 2018-09-12 LAB — IRON,TIBC AND FERRITIN PANEL
FERRITIN: 51 ng/mL (ref 15–77)
IRON SATURATION: 20 % (ref 15–55)
IRON: 73 ug/dL (ref 27–159)
Total Iron Binding Capacity: 365 ug/dL (ref 250–450)
UIBC: 292 ug/dL (ref 131–425)

## 2018-09-12 LAB — CBC WITH DIFFERENTIAL/PLATELET
BASOS: 1 %
Basophils Absolute: 0.1 10*3/uL (ref 0.0–0.2)
EOS (ABSOLUTE): 0.3 10*3/uL (ref 0.0–0.4)
Eos: 3 %
Hematocrit: 41.7 % (ref 34.0–46.6)
Hemoglobin: 14.8 g/dL (ref 11.1–15.9)
IMMATURE GRANS (ABS): 0 10*3/uL (ref 0.0–0.1)
Immature Granulocytes: 0 %
LYMPHS ABS: 2.7 10*3/uL (ref 0.7–3.1)
LYMPHS: 29 %
MCH: 29 pg (ref 26.6–33.0)
MCHC: 35.5 g/dL (ref 31.5–35.7)
MCV: 82 fL (ref 79–97)
Monocytes Absolute: 0.6 10*3/uL (ref 0.1–0.9)
Monocytes: 6 %
NEUTROS ABS: 5.7 10*3/uL (ref 1.4–7.0)
Neutrophils: 61 %
PLATELETS: 319 10*3/uL (ref 150–450)
RBC: 5.11 x10E6/uL (ref 3.77–5.28)
RDW: 13.3 % (ref 12.3–15.4)
WBC: 9.4 10*3/uL (ref 3.4–10.8)

## 2018-09-12 LAB — C-REACTIVE PROTEIN: CRP: 12 mg/L — ABNORMAL HIGH (ref 0–10)

## 2018-09-12 LAB — HEMOGLOBIN A1C
Est. average glucose Bld gHb Est-mCnc: 94 mg/dL
Hgb A1c MFr Bld: 4.9 % (ref 4.8–5.6)

## 2018-09-12 LAB — VITAMIN B12: VITAMIN B 12: 680 pg/mL (ref 232–1245)

## 2018-09-12 LAB — VITAMIN D 25 HYDROXY (VIT D DEFICIENCY, FRACTURES): Vit D, 25-Hydroxy: 32.8 ng/mL (ref 30.0–100.0)

## 2018-09-12 LAB — SEDIMENTATION RATE: Sed Rate: 22 mm/hr (ref 0–32)

## 2018-09-12 LAB — CK: Total CK: 50 U/L (ref 24–173)

## 2018-09-15 NOTE — Progress Notes (Signed)
Lab-only. Not seen by provider. 

## 2018-10-11 ENCOUNTER — Encounter: Payer: Self-pay | Admitting: Family Medicine

## 2018-10-11 ENCOUNTER — Ambulatory Visit (INDEPENDENT_AMBULATORY_CARE_PROVIDER_SITE_OTHER): Payer: Managed Care, Other (non HMO) | Admitting: Family Medicine

## 2018-10-11 ENCOUNTER — Other Ambulatory Visit: Payer: Self-pay

## 2018-10-11 VITALS — BP 115/76 | HR 81 | Temp 98.0°F | Resp 16 | Ht 64.0 in | Wt 216.0 lb

## 2018-10-11 DIAGNOSIS — R002 Palpitations: Secondary | ICD-10-CM | POA: Diagnosis not present

## 2018-10-11 DIAGNOSIS — R5383 Other fatigue: Secondary | ICD-10-CM

## 2018-10-11 DIAGNOSIS — R000 Tachycardia, unspecified: Secondary | ICD-10-CM

## 2018-10-11 DIAGNOSIS — R42 Dizziness and giddiness: Secondary | ICD-10-CM | POA: Diagnosis not present

## 2018-10-11 DIAGNOSIS — R55 Syncope and collapse: Secondary | ICD-10-CM

## 2018-10-11 NOTE — Patient Instructions (Addendum)
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 However, I want to make sure we get you fully evaluated before we blunt or stop any findings that might warrant further evaluation so lets do the Holter Monitor (you will soon get a call from [facility] on [address] in [facility] above the [employer] to come get the equipment) and have you evaluated by [facility].  See below on ways to help reduce the effects/symptoms/exacerbations.   If you have lab work done today you will be contacted with your lab results within the next 2 weeks.  If you have not heard from us then please contact us. The fastest way to get your results is to register for My Chart.   IF you received an x-ray today, you will receive an invoice from [facility]. Please contact [facility] at [phone] with questions or concerns regarding your invoice.   IF you received labwork today, you will receive an invoice from [facility]. Please contact [facility] at [phone] with questions or concerns regarding your invoice.   Our billing staff will not be able to assist you with questions regarding bills from these companies.  You will be contacted with the lab results as soon as they are available. The fastest way to get your results is to activate your My Chart account. Instructions are located on the last page of this paperwork. If you have not heard from us regarding the results in 2 weeks, please contact this office.      Supraventricular Tachycardia, Adult Supraventricular tachycardia (SVT) is a kind of abnormal heartbeat. It makes your heart beat very fast and then beat at a normal speed. A normal resting heartbeat is 60-100 times a minute. This condition can make your heart beat more than 150 times a minute. Times of having a fast heartbeat (episodes) can be scary, but they are usually not  dangerous. However, in some cases, they can lead to heart failure if:  They happen several times per day.  Last for longer than a few seconds. What are the causes? Usually, a normal heartbeat starts when an area called the sinoatrial node releases an electrical signal. In SVT, other areas of the heart send out electrical signals that interfere with the signal from the sinoatrial node. What increases the risk?  Being [age] years old.  Being a woman. The following factors may make you more likely to develop this condition:  Stress.  Tiredness.  Smoking.  Stimulant drugs, such as cocaine and methamphetamine  Alcohol.  Caffeine.  Pregnancy.  Anxiety. What are the signs or symptoms?  A pounding heart. ? A feeling that your heart is skipping beats (palpitations). ? Weakness. ? Trouble getting enough air. ? Pain or tightness in your chest. ? Feeling like you are going to pass out. ? Feeling worried or nervous. ? Dizziness. ? Sweating. ? Feeling like you might throw up. ? Passing out (fainting). ? Tiredness. Sometimes, there are no symptoms. How is this treated?  Vagal nerve stimulation. Ways to do this: ? Holding your breath and pushing, as though you are pooping. ? Massaging an area on one side of your neck. Do not try this yourself. Only a doctor should do this. If done the wrong way, it can lead to a stroke. ? Bending forward with your head between your legs. ? Coughing while bending forward with your head between your legs. ? Closing your  eyes and massaging your eyeballs. Ask a doctor how to do this.  Medicines that prevent attacks.  Medicine to stop an attack given through an IV at the hospital.  A small electric shock (cardioversion) that stops an attack.  Radiofrequency ablation. In this procedure, a small, thin tube (catheter) is used to send radiofrequency energy to the area that is causing the rapid heartbeats. Follow these instructions at  home: Stress   Avoid things that make you feel stressed.  To deal with stress, try: ? Doing yoga, meditation, or being out in nature. ? Listening to relaxing music. ? Doing deep breathing. ? Taking steps to be healthy, such as getting lots of sleep, exercising, and eating a balanced diet. ? Talking with a mental health doctor. Sleep  Try to get at least 7 hours of sleep each night. Tobacco and nicotine   Do not use any products that contain nicotine or tobacco, such as cigarettes, e-cigarettes, and chewing tobacco. If you need help quitting, ask your doctor. Alcohol  If alcohol gives you a fast heartbeat, do not drink alcohol.  Even in alcohol does not seem to give you a fast heartbeat, limit alcohol use,. ? For nonpregnant women, this means no more than 1 drink a day. ? For men, this means no more than 2 drinks a day.  "One drink" means one of these:  12 oz of beer (355 mL).  5 oz of wine (148 mL)  1 oz of hard liquor (44 mL). Caffeine  If caffeine gives you a fast heartbeat, do not eat, drink, or use anything with caffeine in it.  Even if caffeine does not seem to give you a fast heartbeat, limit how much caffeine you eat, drink, or use. Stimulant drugs  Do not use drugs such as cocaine or methamphetamine. If you need help quitting, ask your doctor. General instructions  Stay at a healthy weight.  Exercise regularly. Ask your doctor about good activities for you. Try one of these: ? 150 minutes a week of gentle exercise, like walking or yoga. ? 75 minutes a week of exercise that is very active, like running or swimming.  Try a combination of gentle exercise and very active exercise.  Do home treatments to slow down your heartbeat.  Take over-the-counter and prescription medicines only as told by your doctor. Contact a doctor if:  You have a fast heartbeat more often.  Times of having a fast heartbeat last longer than before.  Home treatments to slow  down your heartbeat do not help.  You have new symptoms. Get help right away if:  You have chest pain.  Your symptoms get worse.  You have trouble breathing.  Your heart beats very fast for more than 20 minutes.  You pass out. These symptoms may be an emergency. Do not wait to see if the symptoms will go away. Get medical help right away. Call your local emergency services (911 in the [place] Do not drive yourself to the hospital. Summary  SVT is a type of abnormal heart beat.  During an episode, our heart rate may be higher than 150 beats per minute  Treatment depends on how often the condition happens and your symptoms. This information is not intended to replace advice given to you by your health care provider. Make sure you discuss any questions you have with your health care provider. Document Released: 10/03/2005 Document Revised: 06/09/2016 Document Reviewed: 06/09/2016 Elsevier Interactive Patient Education  2019 [employer]

## 2018-10-11 NOTE — Progress Notes (Signed)
Subjective:    Patient: Cassandra MoulderMolly Obrien  DOB: 2001/06/13; 17 y.o.   MRN: [id number]  Chief Complaint  Patient presents with  . Anemia    follow-up from 11/26 labs    HPI  Tachycardia, CP, SHoB since August. Pt was seen by cardiologist Dr. Boneta Luckshinehart at [facility] on 11/11 but feels like he didn't believe her. She notes that she thought it was inappropriate that he listed "bipolar" second immed after palpitations her diagnoses at the consult. 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  She has a second visit w/ Dr. Ulyses Amorhinehart's PA on 1/6 to f/u since her treadmill EKG but she would prefer to cancel that and instead would appreciate a second opinion from a cardiologist at a different office who will provide a more complete eval before reassuring her that absolutely nothing is wrong.   These episodes of palpitations and heart racing were occurring 2-3x/wk and lasting for up to 20 min but in the past mo have been continuing to get worse in severity, symptomatology, length, and frequency. During them, she feels dizzy, lightheaded, dyspneic, like your head is underwater With her apple watch she has been able to capture a single lead rhythm strip during multiple of the episodes and has stored the copies on her phone some of which show tachycardia in the 120-150s which pt states will began suddenly when she's at rest or very frequent rate changes with having 2 beats that are very close together and then several normally spaced which will occur many/mult times in one few sec recording.  During one episode of palpitations, she went upstairs to get the propranolol and almost fell as she was so  lightheaded. She called 911 and EMS came to her house but they didn't check her vitals or examine her. Just told her she was having a panic attack and left. Propranolol 10mg  she tolerates fine but she has to take a lot of it to get it to work, 30-40mg  total and then takes 30 min to kick in. THen even though the rate is lower, the irregularity stays.  She is worried about taking to much and causing hypotension so only tried it a few times  She will listen to her heart with a stethoscope and can hear it skipping all around - the sound changes in addition to the irregularity. Valsalva helps some but does not resolve it.    She was on a monitor throughout the whole appointment for the treadmill EKG on 11/21 and had several people looked at the strip while she was resting - before she started the treadmill portion and noted it "looked funny" but cardiologist wasn't in the room at that time and later told her the stress test normal. She felt like she did have one of the episodes BEFORE she was on the treadmill and the exam officially begun but not during it.  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   [place] [facility] [place]    Since Aug,  pt has also been noting many new and increasing joint pains (bilateral back, hips, knees, and mandibular jaw), severe fatigue, and hair falling out. Few other minor things like intermittent temperature instability.  Is under an immense amount of stress due to social situations/support.  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 Just got  off a 24hr shift - had 9 pts all over 150 lbs.  Sinus infection sxs have completely resolved after Augmentin last mo.  Medical History Past Medical History:  Diagnosis Date  . ADHD (attention deficit hyperactivity disorder)   . Bipolar 2 disorder (HCC)   . Depression   . Suicidal ideations    Past Surgical History:  Procedure Laterality Date  . APPENDECTOMY  02/2014  . LAPAROSCOPIC APPENDECTOMY N/A 02/14/2014   Procedure: APPENDECTOMY LAPAROSCOPIC;  Surgeon: Judie Petit. Leonia Corona, MD;  Location: [facility] OR;  Service: Pediatrics;  Laterality: N/A;   Current Outpatient Medications on File Prior to Visit  Medication Sig Dispense Refill  . ALPRAZolam (XANAX) 0.5 MG tablet TAKE 1 TABLET BY MOUTH TWICE A DAY AS NEEDED FOR ANXIETY    . amoxicillin-clavulanate (AUGMENTIN) 875-125 MG tablet Take 1 tablet by mouth 2 (two) times daily. 14 tablet 0  . ARIPiprazole (ABILIFY) 20 MG tablet Take 20 mg by mouth daily.  1  . fluticasone (FLONASE) 50 MCG/ACT nasal spray Place 2 sprays into both nostrils at bedtime. 16 g 2  . lamoTRIgine (LAMICTAL) 150 MG tablet TAKE 2 TABLETS BY MOUTH DAILY. IF OUT FOR MORE THAN 1 WEEK, DON'T RESTART AND CALL MD  1  . levonorgestrel (MIRENA, 52 MG,) 20 MCG/24HR IUD by Intrauterine route.    . lithium carbonate (LITHOBID) 300 MG CR tablet TAKE 2 TABLETS BY MOUTH AT BEDTIME FOR 4 DAYS, THEN 3 TABLETS BY MOUTH AT BEDTIME  1  . propranolol (INDERAL) 10 MG tablet Take 1-2 tablets po prn tachycardia/palpitations. May repeat after 2 hours if sxs persist/recur 60 tablet 0   No current facility-administered medications on file prior to visit.    Allergies  Allergen Reactions  . Prednisone Other (See Comments)    Trouble walking    Family History  Problem Relation Age of Onset  . Bipolar disorder Mother   . ADD / ADHD Father   . Diabetes Maternal Grandmother   . Heart disease Maternal Grandfather    Social History   Socioeconomic History  . Marital status: Single    Spouse  name: n/a  . Number of children: 0  . Years of education: Not on file  . Highest education level: Not on file  Occupational History  . Occupation: Consulting civil engineer  Social Needs  . Financial resource strain: Not on file  . Food insecurity:    Worry: Not on file    Inability: Not on file  . Transportation needs:    Medical: Not on file    Non-medical: Not on file  Tobacco Use  . Smoking status: Never Smoker  . Smokeless tobacco: Never Used  Substance and Sexual Activity  . Alcohol use: No    Alcohol/week: 0.0 standard drinks  . Drug use: No  . Sexual activity: Yes    Birth control/protection: I.U.D.  Lifestyle  . Physical activity:    Days per week: Not on file    Minutes per session: Not on file  . Stress: Not on file  Relationships  . Social connections:    Talks on phone: Not on file    Gets together: Not on file    Attends religious service: Not on file    Active member of club or organization: Not on file    Attends meetings of clubs or organizations: Not on file    Relationship status: Not on file  Other Topics Concern  . Not on file  Social History Narrative   Lives with her maternal grandparents and her mother in [place], [place]).   Her younger half-siblings live with her father and step-mother in [place], [place].   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   Depression screen [facility] 2/9 10/11/2018 08/30/2018 08/02/2018 04/25/2018 06/24/2016  Decreased Interest 0 0 0 0 2  Down, Depressed, Hopeless 0 0 0 0 2  PHQ - 2 Score 0 0 0 0 4  Altered sleeping - - - - 2  Tired, decreased energy - - - - 2  Change in appetite - - - - 2  Feeling bad or failure about yourself  - - - - 2  Trouble concentrating - - - - 2  Moving slowly or fidgety/restless - - - - 2  Suicidal thoughts - - - - 2  PHQ-9 Score - - - - 18  Difficult doing work/chores - - - - -  Some  encounter information is confidential and restricted. Go to Review Flowsheets activity to see all data.    ROS As noted in HPI  Objective:  BP 115/76   Pulse 81   Temp 98 F (36.7 C) (Oral)   Resp 16   Ht 5\' 4"  (1.626 m)   Wt 216 lb (98 kg)   SpO2 95%   BMI 37.08 kg/m  Physical Exam Constitutional:      General: She is not in acute distress.    Appearance: She is well-developed. She is not diaphoretic.     Comments: obese  HENT:     Head: Normocephalic and atraumatic.     Right Ear: External ear normal.     Left Ear: External ear normal.  Eyes:     General: No scleral icterus.    Conjunctiva/sclera: Conjunctivae normal.  Neck:     Musculoskeletal: Normal range of motion and neck supple.     Thyroid: No thyromegaly.  Cardiovascular:     Rate and Rhythm: Normal rate and regular rhythm. Occasional extrasystoles (I listened to her heart sounds for several minutes and did note several episodes during which the rate would change to a distinct regular tachycardia with slightly different tone/intensity to S1 & S2 for a brief ~1-2 sec interval) are present.    Pulses: Normal pulses.     Heart sounds: Heart sounds not distant. No murmur.  Pulmonary:     Effort: Pulmonary effort is normal. No respiratory distress.     Breath sounds: Normal breath sounds.  Lymphadenopathy:     Cervical: No cervical adenopathy.  Skin:    General: Skin is warm and dry.     Findings: No erythema.  Neurological:     Mental Status: She is alert and oriented to person, place, and time.  Psychiatric:  Behavior: Behavior normal.     POC TESTING No visits with results within 3 Day(s) from this visit.  Latest known visit with results is:  Office Visit on 09/11/2018  Component Date Value Ref Range Status  . CRP 09/11/2018 12* 0 - 10 mg/L Final  . Hgb A1c MFr Bld 09/11/2018 4.9  4.8 - 5.6 % Final   Comment:          Prediabetes: 5.7 - 6.4          Diabetes: >6.4          Glycemic control for  adults with diabetes: <7.0   . Est. average glucose Bld gHb Est-m* 09/11/2018 94  mg/dL Final  . Total CK 09/81/191411/26/2019 50  24 - 173 U/L Final  . Total Iron Binding Capacity 09/11/2018 365  250 - 450 ug/dL Final  . UIBC 78/29/562111/26/2019 292  131 - 425 ug/dL Final  . Iron 30/86/578411/26/2019 73  27 - 159 ug/dL Final  . Iron Saturation 09/11/2018 20  15 - 55 % Final  . Ferritin 09/11/2018 51  15 - 77 ng/mL Final  . Vit D, 25-Hydroxy 09/11/2018 32.8  30.0 - 100.0 ng/mL Final   Comment: Vitamin D deficiency has been defined by the Institute of Medicine and an Endocrine Society practice guideline as a level of serum 25-OH vitamin D less than 20 ng/mL (1,2). The Endocrine Society went on to further define vitamin D insufficiency as a level between 21 and 29 ng/mL (2). 1. IOM (Institute of Medicine). 2010. Dietary reference    intakes for calcium and D. [place]: The    [employer]. 2. Holick MF, Binkley Warren, Bischoff-Ferrari HA, et al.    Evaluation, treatment, and prevention of vitamin D    deficiency: an Endocrine Society clinical practice    guideline. JCEM. 2011 Jul; 96(7):1911-30.   . Vitamin B-12 09/11/2018 680  232 - 1,245 pg/mL Final  . Sed Rate 09/11/2018 22  0 - 32 mm/hr Final  . WBC 09/11/2018 9.4  3.4 - 10.8 x10E3/uL Final  . RBC 09/11/2018 5.11  3.77 - 5.28 x10E6/uL Final  . Hemoglobin 09/11/2018 14.8  11.1 - 15.9 g/dL Final  . Hematocrit 69/62/952811/26/2019 41.7  34.0 - 46.6 % Final  . MCV 09/11/2018 82  79 - 97 fL Final  . MCH 09/11/2018 29.0  26.6 - 33.0 pg Final  . MCHC 09/11/2018 35.5  31.5 - 35.7 g/dL Final  . RDW 41/32/440111/26/2019 13.3  12.3 - 15.4 % Final  . Platelets 09/11/2018 319  150 - 450 x10E3/uL Final  . Neutrophils 09/11/2018 61  Not Estab. % Final  . Lymphs 09/11/2018 29  Not Estab. % Final  . Monocytes 09/11/2018 6  Not Estab. % Final  . Eos 09/11/2018 3  Not Estab. % Final  . Basos 09/11/2018 1  Not Estab. % Final  . Neutrophils Absolute 09/11/2018 5.7  1.4 - 7.0  x10E3/uL Final  . Lymphocytes Absolute 09/11/2018 2.7  0.7 - 3.1 x10E3/uL Final  . Monocytes Absolute 09/11/2018 0.6  0.1 - 0.9 x10E3/uL Final  . EOS (ABSOLUTE) 09/11/2018 0.3  0.0 - 0.4 x10E3/uL Final  . Basophils Absolute 09/11/2018 0.1  0.0 - 0.2 x10E3/uL Final  . Immature Granulocytes 09/11/2018 0  Not Estab. % Final  . Immature Grans (Abs) 09/11/2018 0.0  0.0 - 0.1 x10E3/uL Final    Dec 8th recording taken on pt's apple watch showing supraventricular tachycardia - picture is upside down    Picture  of recording taken 12/26 on pt's apple watch demonstrating rapid shifts in rate irregularity with short sudden changes in R-R interval - pic needs to be rotated to Rt 90 deg    Assessment & Plan:   1. Other fatigue   2. Tachycardia   3. Tachycardia with heart rate 141-160 beats per minute   4. Palpitations   5. 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  Will ask for second opinion at [facility] and in the interim obtain Holter Monitor. Pt is having sxs the majority of days so feels confident that we will be able to collect enough episodes to have needed data for diagnosis in 72 hrs. Advised possible that sxs may resolve with long-acting low dose BB but will defer to after Holter and cardiology as do not want to mask any abnormal findings.   See after visit summary for patient specific instructions.  Orders Placed This Encounter  Procedures  . Ambulatory referral to Cardiology    Referral Priority:   Routine    Referral Type:   Consultation    Referral Reason:   Specialty Services Required    Requested Specialty:   Cardiology    Number of Visits Requested:   1  . Holter monitor - 72 hour    Standing Status:   Future    Standing Expiration  Date:   10/11/2028    Scheduling Instructions:     Please send results to myself and to [facility].  Would like to have done asap - thanks    Order Specific Question:   Where should this test be performed?    Answer:   [facility]    Patient verbalized to me that they understand the following: diagnosis, what is being done for them, what to expect and what should be done at home.  Their questions have been answered. They understand that I am unable to predict every possible medication interaction or adverse outcome and that if any unexpected symptoms arise, they should contact us and their pharmacist, as well as never hesitate to seek urgent/emergent care at [facility] or ER if they think it might be warranted.    Norberto Sorenson, MD, MPH Primary Care at [facility] [address] [place], [place]  [address] [phone] Office phone  [phone] Office fax  10/11/18 11:34 AM

## 2018-10-14 ENCOUNTER — Encounter: Payer: Self-pay | Admitting: Family Medicine

## 2018-10-23 ENCOUNTER — Ambulatory Visit (INDEPENDENT_AMBULATORY_CARE_PROVIDER_SITE_OTHER): Payer: Managed Care, Other (non HMO)

## 2018-10-23 DIAGNOSIS — R000 Tachycardia, unspecified: Secondary | ICD-10-CM

## 2018-10-23 DIAGNOSIS — R5383 Other fatigue: Secondary | ICD-10-CM | POA: Diagnosis not present

## 2018-10-23 DIAGNOSIS — R42 Dizziness and giddiness: Secondary | ICD-10-CM

## 2018-10-23 DIAGNOSIS — R002 Palpitations: Secondary | ICD-10-CM

## 2018-10-23 DIAGNOSIS — R55 Syncope and collapse: Secondary | ICD-10-CM

## 2018-12-21 ENCOUNTER — Ambulatory Visit: Payer: Self-pay | Admitting: Cardiology

## 2018-12-21 ENCOUNTER — Telehealth: Payer: Self-pay

## 2018-12-21 DIAGNOSIS — Z5329 Procedure and treatment not carried out because of patient's decision for other reasons: Secondary | ICD-10-CM

## 2018-12-21 NOTE — Progress Notes (Deleted)
Subjective:   Benn Moulder, female    DOB: 02-14-2001, 18 y.o.   MRN: [id number]  Sherren Mocha, MD:  No chief complaint on file.   HPI: Carson Opfer  is a 18 y.o. female  with Palpitations.  The patient is a 18 year old female who presents for a Follow-up for Palpitations. Mrs. Marble is a 18 year old female referred to [place] by Dr. Clelia Croft for evaluation of palpitations.  Patient was previously evaluated by Dr. Joylene Grapes at [facility] in November 2019; however, by PCP report, patient was unhappy with visit and wanted a second opinion. Palpitations started approximatley in August 2019 and have progressively gotten worse. Symptoms are now occuring almost daily, but are can vary between episodes. She has episodes where heart suddenly races and feels irregular, but can also have episodes of irregular heart beat without heart racing. Heart racing can occur with just sitting and resting and can be as high as 150 bpm. Onset is suddenly and gradually resolves.  Patient is a new EMT. Patient was given a prescription of propanolol to use as needed, states that she generally has to take 30-40 mg total to help with her heart racing. She did have one episode of palpitations and had to call EMS that she had near syncope while trying to get her propanolol. Does try to use Valsalva, but does not completely terminate her symptoms.  Patient does not menstrate due to IUD's; however, prior to having IUD in place she had irregular, painful, heavy cycles. No hyperglycemia.   Past Medical History:  Diagnosis Date  . ADHD (attention deficit hyperactivity disorder)   . Bipolar 2 disorder (HCC)   . Depression   . Suicidal ideations     Past Surgical History:  Procedure Laterality Date  . APPENDECTOMY  02/2014  . LAPAROSCOPIC APPENDECTOMY N/A 02/14/2014   Procedure: APPENDECTOMY LAPAROSCOPIC;  Surgeon: Judie Petit. Leonia Corona, MD;  Location: [facility] OR;  Service: Pediatrics;  Laterality: N/A;    Family History  Problem  Relation Age of Onset  . Bipolar disorder Mother   . ADD / ADHD Father   . Diabetes Maternal Grandmother   . Heart disease Maternal Grandfather     Social History   Socioeconomic History  . Marital status: Single    Spouse name: n/a  . Number of children: 0  . Years of education: Not on file  . Highest education level: Not on file  Occupational History  . Occupation: Consulting civil engineer  Social Needs  . Financial resource strain: Not on file  . Food insecurity:    Worry: Not on file    Inability: Not on file  . Transportation needs:    Medical: Not on file    Non-medical: Not on file  Tobacco Use  . Smoking status: Never Smoker  . Smokeless tobacco: Never Used  Substance and Sexual Activity  . Alcohol use: No    Alcohol/week: 0.0 standard drinks  . Drug use: No  . Sexual activity: Yes    Birth control/protection: I.U.D.  Lifestyle  . Physical activity:    Days per week: Not on file    Minutes per session: Not on file  . Stress: Not on file  Relationships  . Social connections:    Talks on phone: Not on file    Gets together: Not on file    Attends religious service: Not on file    Active member of club or organization: Not on file    Attends meetings of  clubs or organizations: Not on file    Relationship status: Not on file  . Intimate partner violence:    Fear of current or ex partner: Not on file    Emotionally abused: Not on file    Physically abused: Not on file    Forced sexual activity: Not on file  Other Topics Concern  . Not on file  Social History Narrative   Lives with her maternal grandparents and her mother in [place], [place]).   Her younger half-siblings live with her father and step-mother in [place], [place].   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    No outpatient medications have been marked as taking for the 12/21/18 encounter  (Appointment) with Toniann Fail, NP.     Review of Systems  Constitution: Negative for decreased appetite, malaise/fatigue, weight gain and weight loss.  Eyes: Negative for visual disturbance.  Cardiovascular: Positive for irregular heartbeat (Rapid heart rate) and syncope (near syncope). Negative for chest pain, claudication, dyspnea on exertion, leg swelling, orthopnea and palpitations.  Respiratory: Negative for hemoptysis and wheezing.   Endocrine: Negative for cold intolerance and heat intolerance.  Hematologic/Lymphatic: Does not bruise/bleed easily.  Skin: Negative for nail changes.  Musculoskeletal: Negative for muscle weakness and myalgias.  Gastrointestinal: Negative for abdominal pain, change in bowel habit, nausea and vomiting.  Neurological: Negative for difficulty with concentration, dizziness, focal weakness and headaches.  Psychiatric/Behavioral: Negative for altered mental status and suicidal ideas.  All other systems reviewed and are negative.      Objective:     There were no vitals taken for this visit.  Cardiac studies:  EKG 11/09/2018: Normal sinus rhythm at 75 bpm, normal axis, early repolarization normal variant. No evidence of ischemia. Normal EKG.  3 day zio patch from PCP office 11/09/2018: Sinus rhythm Rare premature atrial contractions Long RP tachycardia, likely represents sinus tachycardia  Echocardiogram [2019]: ***  Treadmill stress test [2019]: 09/05/18: Patient completed 5-6 minutes Bruce protocol reaching 91% predicted target heart rate at a workload of 7 METS. Non limiting chest pain with exercise. No ischemic EKG changes or arrhythmias at this moderate exercise workload. Moderate risk treadmill EKG at a moderate exercise workload.  Labs:  Lipid Panel     Component Value Date/Time   CHOL 130 03/30/2017 1408   TRIG 165 (H) 03/30/2017 1408   HDL 32 (L) 03/30/2017 1408   CHOLHDL 4.1 03/30/2017 1408   LDLCALC 65 03/30/2017 1408    CMP     Component Value Date/Time   NA 140 08/02/2018 1703   K 4.1 08/02/2018 1703   CL 101 08/02/2018 1703   CO2 22 08/02/2018 1703   GLUCOSE 77 08/02/2018 1703   GLUCOSE 80 06/24/2016 1704   BUN 7 08/02/2018 1703   CREATININE 0.64 08/02/2018 1703   CREATININE 0.57 11/26/2014 1704   CALCIUM 9.5 08/02/2018 1703   PROT 7.5 08/02/2018 1703   ALBUMIN 4.6 08/02/2018 1703   AST 14 08/02/2018 1703   ALT 17 08/02/2018 1703   ALKPHOS 100 08/02/2018 1703   BILITOT 0.3 08/02/2018 1703   GFRNONAA 130 08/02/2018 1703   GFRAA 150 08/02/2018 1703   CBC Latest Ref Rng & Units 09/11/2018 08/30/2018 08/02/2018  WBC 3.4 - 10.8 x10E3/uL 9.4 12.0(A) 10.7  Hemoglobin 11.1 - 15.9 g/dL 45.4 15.0(A) 14.3  Hematocrit 34.0 - 46.6 % 41.7 43.5(A) 42.6  Platelets 150 - 450 x10E3/uL 319 - 395      Physical Exam  Constitutional: She appears well-developed. No distress.  Morbidly obese  HENT:  Head: Atraumatic.  Eyes: Conjunctivae are normal.  Neck: Neck supple. No thyromegaly present.  Short neck and difficult to evaluate JVP  Cardiovascular: Normal rate, regular rhythm and normal heart sounds. Exam reveals no gallop.  No murmur heard. Pulses:      Carotid pulses are 2+ on the right side and 2+ on the left side.      Dorsalis pedis pulses are 2+ on the right side and 2+ on the left side.       Posterior tibial pulses are 2+ on the right side and 2+ on the left side.  Femoral and popliteal pulse difficult to feel due to patient's body habitus.   Pulmonary/Chest: Effort normal and breath sounds normal.  Abdominal: Soft. Bowel sounds are normal.  Musculoskeletal: Normal range of motion.        General: No edema.  Neurological: She is alert.  Skin: Skin is warm and dry.  Psychiatric: She has a normal mood and affect.           Assessment & Recommendations:  There are no diagnoses linked to this encounter.  Mrs. Asante is a 18 year old female referred to [place] by Dr. Clelia Croft for evaluation  of palpitations.  I have reviewed her ziopatch readings and telemetry strip from PCP office that occured during symptoms. 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 Episodes of irregular heartbeat related to occasional PACs and sinus arrhythmia. No atrial fibrillation was noted. I will discontinue her propranolol and switch her to Acebutolol 200 mg daily. I have encouraged her to use high-dose B6 and B12 vitamins to also help with her PACs.  Etiology for IST and also PACs was discussed and patient and her grandmother were reassured. Do not have her recent echocardiogram from that point, we'll request for records. Had normal stress test, do not suspect any myocardial ischemia. Discussed life altering versus life-threatening. I have encouraged her to start regular exercise to help with her symptoms and also improve vagal tone. Discussed that weight loss would also help with her symptoms. Advised her to avoid caffiene intake. I'll see her back in 6 weeks for follow-up on IST.    Altamese Lincoln, MSN, APRN, FNP-C [facility] Office: [phone] Fax: [phone]

## 2018-12-31 ENCOUNTER — Other Ambulatory Visit: Payer: Self-pay | Admitting: Cardiology

## 2018-12-31 DIAGNOSIS — R000 Tachycardia, unspecified: Secondary | ICD-10-CM

## 2019-01-16 ENCOUNTER — Telehealth: Payer: Self-pay

## 2019-01-30 ENCOUNTER — Telehealth: Payer: Managed Care, Other (non HMO) | Admitting: Nurse Practitioner

## 2019-01-30 DIAGNOSIS — Z20822 Contact with and (suspected) exposure to covid-19: Secondary | ICD-10-CM

## 2019-01-30 DIAGNOSIS — R6889 Other general symptoms and signs: Principal | ICD-10-CM

## 2019-01-30 MED ORDER — BENZONATATE 100 MG PO CAPS: 100.0000 mg | ORAL_CAPSULE | Freq: Three times a day (TID) | ORAL | 0 refills | Status: DC | PRN

## 2019-01-30 NOTE — Progress Notes (Signed)
E-Visit for Corona Virus Screening  Based on your current symptoms, you may very well have the virus, however your symptoms are mild. Currently, not all patients are being tested. If the symptoms are mild and there is not a known exposure, performing the test is not indicated.  Coronavirus disease 2019 (COVID-19) is a respiratory illness that can spread from person to person. The virus that causes COVID-19 is a new virus that was first identified in the country of [place] but is now found in multiple other countries and has spread to the [place].  Symptoms associated with the virus are mild to severe fever, cough, and shortness of breath. There is currently no vaccine to protect against COVID-19, and there is no specific antiviral treatment for the virus.   To be considered HIGH RISK for Coronavirus (COVID-19), you have to meet the following criteria:  . Traveled to [place], [place], [place], [place] or [place]; or in the [place] to [place], [place], [place], or [place]; and have fever, cough, and shortness of breath within the last 2 weeks of travel OR  . Been in close contact with a person diagnosed with COVID-19 within the last 2 weeks and have fever, cough, and shortness of breath  . IF YOU DO NOT MEET THESE CRITERIA, YOU ARE CONSIDERED LOW RISK FOR COVID-19.   It is vitally important that if you feel that you have an infection such as this virus or any other virus that you stay home and away from places where you may spread it to others.  You should self-quarantine for 14 days if you have symptoms that could potentially be coronavirus and avoid contact with people age 65 and older.   You can use medication such as A prescription cough medication called Tessalon Perles 100 mg. You may take 1-2 capsules every 8 hours as needed for cough  You may also take acetaminophen (Tylenol) as needed for fever.   Reduce your risk of any infection by using the same precautions used for  avoiding the common cold or flu:  . Wash your hands often with soap and warm water for at least 20 seconds.  If soap and water are not readily available, use an alcohol-based hand sanitizer with at least 60% alcohol.  . If coughing or sneezing, cover your mouth and nose by coughing or sneezing into the elbow areas of your shirt or coat, into a tissue or into your sleeve (not your hands). . Avoid shaking hands with others and consider head nods or verbal greetings only. . Avoid touching your eyes, nose, or mouth with unwashed hands.  . Avoid close contact with people who are sick. . Avoid places or events with large numbers of people in one location, like concerts or sporting events. . Carefully consider travel plans you have or are making. . If you are planning any travel outside or inside the [place], visit the CDC's Travelers' Health webpage for the latest health notices. . If you have some symptoms but not all symptoms, continue to monitor at home and seek medical attention if your symptoms worsen. . If you are having a medical emergency, call 911.  HOME CARE . Only take medications as instructed by your medical team. . Drink plenty of fluids and get plenty of rest. . A steam or ultrasonic humidifier can help if you have congestion.   GET HELP RIGHT AWAY IF: . You develop worsening fever. . You become short of breath . You cough   up blood. . Your symptoms become more severe MAKE SURE YOU   Understand these instructions.  Will watch your condition.  Will get help right away if you are not doing well or get worse.  Your e-visit answers were reviewed by a board certified advanced clinical practitioner to complete your personal care plan.  Depending on the condition, your plan could have included both over the counter or prescription medications.  If there is a problem please reply once you have received a response from your provider. Your safety is important to us.  If you have drug  allergies check your prescription carefully.    You can use MyChart to ask questions about today's visit, request a non-urgent call back, or ask for a work or school excuse for 24 hours related to this e-Visit. If it has been greater than 24 hours you will need to follow up with your provider, or enter a new e-Visit to address those concerns. You will get an e-mail in the next two days asking about your experience.  I hope that your e-visit has been valuable and will speed your recovery. Thank you for using e-visits.  5 minutes spent reviewing and documenting in chart.  

## 2019-01-31 ENCOUNTER — Other Ambulatory Visit: Payer: Self-pay

## 2019-01-31 ENCOUNTER — Ambulatory Visit (INDEPENDENT_AMBULATORY_CARE_PROVIDER_SITE_OTHER): Payer: Managed Care, Other (non HMO) | Admitting: Cardiology

## 2019-01-31 ENCOUNTER — Encounter: Payer: Self-pay | Admitting: Cardiology

## 2019-01-31 VITALS — HR 100 | Ht 64.0 in | Wt 210.0 lb

## 2019-01-31 DIAGNOSIS — I491 Atrial premature depolarization: Secondary | ICD-10-CM | POA: Diagnosis not present

## 2019-01-31 DIAGNOSIS — E669 Obesity, unspecified: Secondary | ICD-10-CM

## 2019-01-31 DIAGNOSIS — B349 Viral infection, unspecified: Secondary | ICD-10-CM | POA: Diagnosis not present

## 2019-01-31 DIAGNOSIS — R000 Tachycardia, unspecified: Secondary | ICD-10-CM

## 2019-01-31 NOTE — Progress Notes (Signed)
Subjective:   Cassandra Obrien, female    DOB: 2000/11/15, 18 y.o.   MRN: [id number]  Patient, No Pcp Per:  Chief Complaint  Patient presents with  . Tachycardia   This visit type was conducted due to national recommendations for restrictions regarding the COVID-19 Pandemic (e.g. social distancing).  This format is felt to be most appropriate for this patient at this time.  All issues noted in this document were discussed and addressed.  No physical exam was performed (except for noted visual exam findings with Telehealth visits).  The patient has consented to conduct a Telehealth visit and understands insurance will be billed.   I discussed the limitations of evaluation and management by telemedicine and the availability of in person appointments. The patient expressed understanding and agreed to proceed.  Virtual Visit via Video Note is as below  I connected with Cassandra Obrien, on 01/31/19 at 1000 by a video enabled telemedicine application and verified that I am speaking with the correct person using two identifiers.     I have discussed with her regarding the safety during COVID Pandemic and steps and precautions including social distancing with the patient.    HPI: Cassandra Obrien  is a 18 y.o. female  with inappropriate sinus tachycardia, PAC's, bipolar disorder last evaluated by [place] in Jan 2020.  She was noted to have PAC's, sinus arrhythmia, and long RP tachycardia likely suggestive of sinus tachycardia on Ziopatch monitor in Jan 2020. She had had worsening episodes of heart racing along with irregular heart beat sometimes separate form her heart racing. She was started on Acebutolol at her last office visit. This is a 3 month follow up visit, she is currently under quarantine due COVID-19 symptoms.  She has noticed improvement in her heart racing symptoms since being on the medication and is tolerating well. She does continue to notice irregular heart beat.   Patient works as a Museum/gallery exhibitions officer  and reports that she was exposed to a patient without PPE about 1 1/2 weeks ago that had all the symptoms of COVID. For the last 48 hours she has had fever, throat pain and swelling, abdominal pain, muscle aches, and intermittent shortness of breath. She did do a online self assessment and states testing was not recommended. Her partner also developed symptoms 4 days ago.   Patient does not menstrate due to IUD's; however, prior to having IUD in place she had irregular, painful, heavy cycles. No hyperglycemia.   Past Medical History:  Diagnosis Date  . ADHD (attention deficit hyperactivity disorder)   . Bipolar 2 disorder (HCC)   . Depression   . Suicidal ideations     Past Surgical History:  Procedure Laterality Date  . LAPAROSCOPIC APPENDECTOMY N/A 02/14/2014   Procedure: APPENDECTOMY LAPAROSCOPIC;  Surgeon: Judie Petit. Leonia Corona, MD;  Location: [facility] OR;  Service: Pediatrics;  Laterality: N/A;    Family History  Problem Relation Age of Onset  . Bipolar disorder Mother   . ADD / ADHD Father   . Diabetes Maternal Grandmother   . Heart disease Maternal Grandfather     Social History   Socioeconomic History  . Marital status: Single    Spouse name: n/a  . Number of children: 0  . Years of education: Not on file  . Highest education level: Not on file  Occupational History  . Occupation: Consulting civil engineer  Social Needs  . Financial resource strain: Not on file  . Food insecurity:    Worry: Not on  file    Inability: Not on file  . Transportation needs:    Medical: Not on file    Non-medical: Not on file  Tobacco Use  . Smoking status: Never Smoker  . Smokeless tobacco: Never Used  Substance and Sexual Activity  . Alcohol use: No    Alcohol/week: 0.0 standard drinks  . Drug use: No  . Sexual activity: Yes    Birth control/protection: I.U.D.  Lifestyle  . Physical activity:    Days per week: Not on file    Minutes per session: Not on file  . Stress: Not on file  Relationships  .  Social connections:    Talks on phone: Not on file    Gets together: Not on file    Attends religious service: Not on file    Active member of club or organization: Not on file    Attends meetings of clubs or organizations: Not on file    Relationship status: Not on file  . Intimate partner violence:    Fear of current or ex partner: Not on file    Emotionally abused: Not on file    Physically abused: Not on file    Forced sexual activity: Not on file  Other Topics Concern  . Not on file  Social History Narrative   Lives with her maternal grandparents and her mother in [place], [place]   Her younger half-siblings live with her father and step-mother in [place], [place].   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    Current Meds  Medication Sig  . acebutolol (SECTRAL) 200 MG capsule TAKE 1 CAPSULE BY MOUTH EVERY DAY. STOP TAKING PROPRANOLOL  . levonorgestrel (MIRENA, 52 MG,) 20 MCG/24HR IUD by Intrauterine route.  . lithium carbonate (LITHOBID) 300 MG CR tablet TAKE 2 TABLETS BY MOUTH AT BEDTIME FOR 4 DAYS, THEN 3 TABLETS BY MOUTH AT BEDTIME     Review of Systems  Constitution: Positive for fever. Negative for decreased appetite, malaise/fatigue, weight gain and weight loss.  HENT: Positive for sore throat.   Eyes: Negative for visual disturbance.  Cardiovascular: Positive for palpitations. Negative for chest pain, claudication, dyspnea on exertion, leg swelling, orthopnea and syncope.  Respiratory: Positive for shortness of breath. Negative for hemoptysis and wheezing.   Endocrine: Negative for cold intolerance and heat intolerance.  Hematologic/Lymphatic: Does not bruise/bleed easily.  Skin: Negative for nail changes.  Musculoskeletal: Positive for myalgias. Negative for muscle weakness.  Gastrointestinal: Negative for abdominal pain, change in bowel habit,  nausea and vomiting.  Neurological: Positive for headaches. Negative for difficulty with concentration, dizziness and focal weakness.  Psychiatric/Behavioral: Negative for altered mental status and suicidal ideas.  All other systems reviewed and are negative.      Objective:     Pulse 100, height 5\' 4"  (1.626 m), weight 210 lb (95.3 kg).  Cardiac studies:  3 day zio patch from PCP office 11/09/2018: Sinus rhythm Rare premature atrial contractions Long RP tachycardia, likely represents sinus tachycardia  Treadmill stress test 09/05/18: Patient completed 5-6 minutes Bruce protocol reaching 91% predicted target heart rate at a workload of 7 METS. Non limiting chest pain with exercise. No ischemic EKG changes or arrhythmias at this moderate exercise workload. Moderate risk treadmill EKG at a moderate exercise workload.  Recent Labs:  Physical Exam  Constitutional: She is oriented to person, place, and time.  She appears well-developed and well-nourished. No distress.  Pulmonary/Chest: Effort normal. No respiratory distress.  Neurological: She is alert and oriented to person, place, and time.  Psychiatric: She has a normal mood and affect. Her behavior is normal.       Assessment & Recommendations:  1. Inappropriate sinus tachycardia Heart racing symptoms have improved since being on Acebutolol. Heart rate has increased the last few days likely secondary to viral illness and fever. Will continue with Acebutolol, but will possibly increase the dose due to continued occasional palpitations suggestive of PAC's as she is on low dose. Advised her to start regular cardio exercises once she is able to also help with this.   2. Premature atrial contraction Will consider increasing her dose of Acebutolol once she has recovered from her viral illness to see if this will improve her PAC's.  3. Obesity, Class II, BMI 35-39.9 She will continue to need weight loss. She has lost a few pounds since  seen by [place] in Jan. Encouraged her to continue with this.   4. Viral illness Her symptoms are concerning especially as she is a EMT and was recently exposed to a patient with symptoms of COVID and not wearing PPE. I have encouraged her to contact her PCP to see if she should be tested. She will continue to need symptomatic management.    Plan: I have asked her to call the office in 2 weeks to let me know how she is doing and will consider increasing her dose of Acebutolol at that time. I would like for her to come back into the office in 4 months for follow up on IST and PAC.   [place], MSN, APRN, FNP-C [facility] Office: [phone] Fax: [phone]

## 2019-02-01 ENCOUNTER — Ambulatory Visit: Payer: Self-pay | Admitting: *Deleted

## 2019-02-01 NOTE — Telephone Encounter (Signed)
Pt called with symptoms of fever, sore throat, headache, body aches, non productive cough, loss of taste.  Her temp last night was 102.2, she took Tylenol and today her temp is 98.9. She came in contact with someone who tested positive for the coronavirus about 12 days ago. 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 She said that she was close ot her for about 5 mins before putting on  Her full  PPEs. Advised patient that the offices are not testing the patients in the office and that she would have to be admitted to be tested at the hospital. Pt voiced understanding. Advised that if she keeps having an elevated temp, she should go to an urgent care this weekend.  Advised to call back on Monday and schedule an e visit with a provider at the office if needed. And to call 911 for respiratory distress, chest pain or tightness or increase in any other symptoms. She needs to monitor her temp and stay quarantine until speaking with a provider. Pt voiced understanding. Routing to flow at PCP for review  Reason for Disposition . [1] Fever (or feeling feverish) OR cough AND [2] within 14 Days of COVID-19 EXPOSURE (Close Contact)  Answer Assessment - Initial Assessment Questions 1. CLOSE CONTACT: "Who is the person with the confirmed or suspected COVID-19 infection that you were exposed to?"     A patient that she transported to the hospital. 2. PLACE of CONTACT: "Where were you when you were exposed to COVID-19?" (e.g., home, school, medical waiting room; which city?)     In the ambulance, in [place] 3. TYPE of CONTACT: "How much contact was there?" (e.g., sitting next to, live in same house, work in same office, same building)     Sitting next to her and touching her 4. DURATION of CONTACT: "How long were you in contact with the COVID-19 patient?" (e.g., a few seconds, passed by person, a few minutes, live with the patient)     Without the PPE about 5 mins  and when it was on 45 to 50 mins 5. DATE of CONTACT: "When did you have contact with a COVID-19 patient?" (e.g., how many days ago)     About 12 days ago 6. TRAVEL: "Have you traveled out of the country recently?" If so, "When and where?"     * Also ask about out-of-state travel, since the CDC has identified some high risk cities for community spread in the [place].     * Note: Travel becomes less relevant if there is widespread community transmission where the patient lives.     no 7. COMMUNITY SPREAD: "Are there lots of cases or COVID-19 (community spread) where you live?" (See public health department website, if unsure)   * MAJOR community spread: high number of cases; numbers of cases are increasing; many people hospitalized.   * MINOR community spread: low number of cases; not increasing; few or no people hospitalized     Community spread 8. SYMPTOMS: "Do you have any symptoms?" (e.g., fever, cough, breathing difficulty)     Fever, cough, some shortness of breath 9. PREGNANCY OR POSTPARTUM: "Is there any chance you are pregnant?" "When was your last menstrual period?" "Did you deliver in the last 2 weeks?"     Not pregnant. LMP has an IUD 10. HIGH RISK: "Do you have any heart or lung problems? Do you have a weak immune system?" (e.g., CHF, COPD,  asthma, HIV positive, chemotherapy, renal failure, diabetes mellitus, sickle cell anemia)       PAC's,  Protocols used: CORONAVIRUS (COVID-19) EXPOSURE-A-AH

## 2019-02-01 NOTE — Telephone Encounter (Signed)
Pt's grandmother called back and stated that she has worsened and is taking her to [facility].

## 2019-02-02 ENCOUNTER — Encounter: Payer: Self-pay | Admitting: Emergency Medicine

## 2019-02-02 ENCOUNTER — Emergency Department (INDEPENDENT_AMBULATORY_CARE_PROVIDER_SITE_OTHER): Payer: Managed Care, Other (non HMO)

## 2019-02-02 ENCOUNTER — Emergency Department
Admission: EM | Admit: 2019-02-02 | Discharge: 2019-02-02 | Disposition: A | Discharge status: Home / Self Care | Payer: Managed Care, Other (non HMO) | Source: Home / Self Care

## 2019-02-02 DIAGNOSIS — J069 Acute upper respiratory infection, unspecified: Secondary | ICD-10-CM | POA: Diagnosis not present

## 2019-02-02 DIAGNOSIS — R05 Cough: Secondary | ICD-10-CM

## 2019-02-02 DIAGNOSIS — R079 Chest pain, unspecified: Secondary | ICD-10-CM | POA: Diagnosis not present

## 2019-02-02 DIAGNOSIS — B9789 Other viral agents as the cause of diseases classified elsewhere: Secondary | ICD-10-CM | POA: Diagnosis not present

## 2019-02-02 DIAGNOSIS — J029 Acute pharyngitis, unspecified: Secondary | ICD-10-CM

## 2019-02-02 MED ORDER — ALBUTEROL SULFATE HFA 108 (90 BASE) MCG/ACT IN AERS: 1.0000 | INHALATION_SPRAY | Freq: Four times a day (QID) | RESPIRATORY_TRACT | 0 refills | Status: DC | PRN

## 2019-02-02 NOTE — ED Provider Notes (Signed)
[facility]    CSN: [id number] Arrival date & time: 02/02/19  0909     History   Chief Complaint Chief Complaint  Patient presents with  . Cough    HPI Cassandra Obrien is a 18 y.o. female.   HPI Cassandra Obrien is a 18 y.o. female presenting to UC with c/o 3 days of intermittent fever, mild intermittent SOB and dry cough.  Sore throat from the cough.  She has taken Benadryl and Tylenol with temporary relief. 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  Pt has not been informed of any positive test.  Pt denies n/v/d. Denies chest pain or SOB at this time. She has had an inhaler in the past when she had bronchospasm a few years ago but she does not have asthma.   Past Medical History:  Diagnosis Date  . ADHD (attention deficit hyperactivity disorder)   . Bipolar 2 disorder (HCC)   . Depression   . Suicidal ideations     Patient Active Problem List   Diagnosis Date Noted  . Inappropriate sinus tachycardia 01/31/2019  . Premature atrial contraction 01/31/2019  . Bipolar disorder (HCC) 12/22/2014  . MDD (major depressive disorder), recurrent severe, without psychosis (HCC) 12/22/2014  . Obesity, Class II, BMI 35-39.9 11/27/2012    Past Surgical History:  Procedure Laterality Date  . LAPAROSCOPIC APPENDECTOMY N/A 02/14/2014   Procedure: APPENDECTOMY LAPAROSCOPIC;  Surgeon: Judie Petit. Leonia Corona, MD;  Location: [facility] OR;  Service: Pediatrics;  Laterality: N/A;    OB History    Gravida  0   Para  0   Term  0   Preterm  0   AB  0   Living  0     SAB  0   TAB  0   Ectopic  0   Multiple  0   Live Births  0            Home Medications    Prior to Admission medications   Medication Sig Start Date End Date Taking? Authorizing Provider  acebutolol (SECTRAL) 200 MG capsule TAKE 1 CAPSULE BY MOUTH EVERY DAY. STOP TAKING PROPRANOLOL 12/31/18   Toniann Fail, NP  albuterol (VENTOLIN HFA) 108 (90 Base) MCG/ACT inhaler Inhale 1-2 puffs into the lungs every 6 (six) hours as needed for wheezing or shortness of breath. 02/02/19   Lurene Shadow, PA-C  levonorgestrel (MIRENA, 52 MG,) 20 MCG/24HR IUD by Intrauterine route. 06/18/17   [provider]  lithium carbonate (LITHOBID) 300 MG CR tablet TAKE 2 TABLETS BY MOUTH AT BEDTIME FOR 4 DAYS, THEN 3 TABLETS BY MOUTH AT BEDTIME 04/08/18   [provider]    Family History Family History  Problem Relation Age of Onset  . Bipolar disorder Mother   . ADD / ADHD Father   . Diabetes Maternal Grandmother   . Heart disease Maternal Grandfather     Social History Social History   Tobacco Use  . Smoking status: Never Smoker  . Smokeless tobacco: Never Used  Substance Use Topics  . Alcohol use: No    Alcohol/week: 0.0 standard drinks  . Drug use: No     Allergies   Prednisone   Review of Systems Review of Systems  Constitutional: Positive for fever. Negative for chills.  HENT: Positive for congestion, postnasal drip and sore throat. Negative for ear pain, trouble swallowing  and voice change.   Respiratory: Positive for cough and shortness of breath.   Cardiovascular: Negative for chest pain and palpitations.  Gastrointestinal: Negative for abdominal pain, diarrhea, nausea and vomiting.  Musculoskeletal: Negative for arthralgias, back pain and myalgias.  Skin: Negative for rash.     Physical Exam Triage Vital Signs ED Triage Vitals  Enc Vitals Group     BP 02/02/19 0944 125/86     Pulse --      Resp --      Temp 02/02/19 0944 98 F (36.7 C)     Temp Source 02/02/19 0944 Oral     SpO2 02/02/19 0944 99 %     Weight 02/02/19 0945 180 lb (81.6 kg)     Height --      Head Circumference --      Peak Flow --      Pain Score 02/02/19 0945 0     Pain Loc --      Pain Edu? --      Excl. in GC? --    No data found.  Updated Vital Signs BP 125/86 (BP  Location: Right Arm)   Pulse 90   Temp 98 F (36.7 C) (Oral)   Wt 180 lb (81.6 kg)   SpO2 99%   BMI 30.90 kg/m   Visual Acuity Right Eye Distance:   Left Eye Distance:   Bilateral Distance:    Right Eye Near:   Left Eye Near:    Bilateral Near:     Physical Exam Vitals signs and nursing note reviewed.  Constitutional:      Appearance: Normal appearance. She is well-developed.  HENT:     Head: Normocephalic and atraumatic.     Right Ear: Tympanic membrane normal.     Left Ear: Tympanic membrane normal.     Nose: Nose normal.     Mouth/Throat:     Lips: Pink.     Mouth: Mucous membranes are moist.     Pharynx: Oropharynx is clear. Uvula midline. No pharyngeal swelling, oropharyngeal exudate, posterior oropharyngeal erythema or uvula swelling.  Eyes:     Conjunctiva/sclera: Conjunctivae normal.  Neck:     Musculoskeletal: Normal range of motion and neck supple.  Cardiovascular:     Rate and Rhythm: Normal rate and regular rhythm.  Pulmonary:     Effort: Pulmonary effort is normal. No respiratory distress.     Breath sounds: Normal breath sounds. No stridor. No wheezing, rhonchi or rales.  Musculoskeletal: Normal range of motion.  Skin:    General: Skin is warm and dry.     Capillary Refill: Capillary refill takes less than 2 seconds.  Neurological:     Mental Status: She is alert and oriented to person, place, and time.  Psychiatric:        Behavior: Behavior normal.      UC Treatments / Results  Labs (all labs ordered are listed, but only abnormal results are displayed) Labs Reviewed - No data to display  EKG None  Radiology Dg Chest 2 View  Result Date: 02/02/2019 CLINICAL DATA:  Pt c/o cough and fever x 3 days. 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 cough, shortness of breath EXAM: CHEST - 2 VIEW COMPARISON:  None. FINDINGS: Normal mediastinum and cardiac silhouette. Normal pulmonary  vasculature. No evidence of effusion, infiltrate, or pneumothorax. No acute bony abnormality. IMPRESSION: Normal chest radiograph. Electronically Signed   By:  Genevive BiStewart  Edmunds M.D.   On: 02/02/2019 10:26    Procedures Procedures (including critical care time)  Medications Ordered in UC Medications - No data to display  Initial Impression / Assessment and Plan / UC Course  I have reviewed the triage vital signs and the nursing notes.  Pertinent labs & imaging results that were available during my care of the patient were reviewed by me and considered in my medical decision making (see chart for details).     Vitals: WNL including O2 Sat 99% on RA Lungs: CTAB, no wheeze CXR to further evaluate potential early pneumonia or bronchitis given 3 days of fever and intermittent SOB.  CXR- normal, reassured pt Will have pt try trial of albuterol as she has used in the past. Encouraged f/u with PCP Discussed isolation precautions given possible exposure to Covid-19 Discussed symptoms that warrant emergent care in the ED.  Final Clinical Impressions(s) / UC Diagnoses   Final diagnoses:  Viral URI with cough  Sore throat     Discharge Instructions      Your chest x-ray was normal today, there is no evidence of bronchitis or pneumonia.  Your symptoms are likely due to a virus. No antibiotics are indicated at this time.  You may take 500mg  acetaminophen every 4-6 hours or in combination with ibuprofen 400-600mg  every 6-8 hours as needed for pain, inflammation, and fever.  Be sure to well hydrated with clear liquids and get at least 8 hours of sleep at night, preferably more while sick.   Please follow up with family medicine in 1 week if needed.  Due to concern for possible exposure to Covid-19 it is advised that you self-isolate for AT LEAST the next 7 days and 3 days after your last fever without the use of anti-fever medications such as Tylenol or Motrin. You may use the inhaler  every 4-6 hours as needed for wheeze/chest tightness, however, if you are needed to use it more frequently, please go to the hospital for further evaluation and treatment of your symptoms.   Please read the guidelines on tips for limiting risk to others by staying home and keeping your mouth and nose covered if you do have to be around family, go to the pharmacy, or doctor's office/hospital.     ED Prescriptions    Medication Sig Dispense Auth. Provider   albuterol (VENTOLIN HFA) 108 (90 Base) MCG/ACT inhaler Inhale 1-2 puffs into the lungs every 6 (six) hours as needed for wheezing or shortness of breath. 1 Inhaler Lurene ShadowPhelps, Jayme Mednick O, PA-C     Controlled Substance Prescriptions [place] Controlled Substance Registry consulted? Not Applicable   Rolla Platehelps, Nakhi Choi O, PA-C 02/02/19 1225

## 2019-02-02 NOTE — Discharge Instructions (Signed)
°  Your chest x-ray was normal today, there is no evidence of bronchitis or pneumonia.  Your symptoms are likely due to a virus. No antibiotics are indicated at this time.  You may take 500mg  acetaminophen every 4-6 hours or in combination with ibuprofen 400-600mg  every 6-8 hours as needed for pain, inflammation, and fever.  Be sure to well hydrated with clear liquids and get at least 8 hours of sleep at night, preferably more while sick.   Please follow up with family medicine in 1 week if needed.  Due to concern for possible exposure to Covid-19 it is advised that you self-isolate for AT LEAST the next 7 days and 3 days after your last fever without the use of anti-fever medications such as Tylenol or Motrin. You may use the inhaler every 4-6 hours as needed for wheeze/chest tightness, however, if you are needed to use it more frequently, please go to the hospital for further evaluation and treatment of your symptoms.   Please read the guidelines on tips for limiting risk to others by staying home and keeping your mouth and nose covered if you do have to be around family, go to the pharmacy, or doctor's office/hospital.

## 2019-02-02 NOTE — ED Triage Notes (Signed)
Pt c/o cough and fever x3 days. 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

## 2019-02-04 NOTE — Telephone Encounter (Signed)
Seen on 01/31/2019

## 2019-02-04 NOTE — Telephone Encounter (Signed)
Pt was evaluated at the ER on 02/02/2019 for URI and treated.

## 2019-02-06 ENCOUNTER — Telehealth: Payer: Self-pay | Admitting: *Deleted

## 2019-02-06 NOTE — Telephone Encounter (Signed)
Called to do COVID f/u call and questionaire no answer and her VM is full.

## 2019-02-07 NOTE — Telephone Encounter (Signed)
Called to do COVID f/u call and questionnaire. No answer and VM is full.

## 2019-02-08 NOTE — Telephone Encounter (Signed)
Attempted to call- no answer

## 2019-02-24 ENCOUNTER — Other Ambulatory Visit: Payer: Self-pay | Admitting: Cardiology

## 2019-02-24 DIAGNOSIS — R000 Tachycardia, unspecified: Secondary | ICD-10-CM

## 2019-02-25 NOTE — Telephone Encounter (Signed)
Please fill

## 2019-04-24 ENCOUNTER — Ambulatory Visit (INDEPENDENT_AMBULATORY_CARE_PROVIDER_SITE_OTHER): Payer: 59 | Admitting: Cardiology

## 2019-04-24 ENCOUNTER — Ambulatory Visit: Payer: Managed Care, Other (non HMO)

## 2019-04-24 ENCOUNTER — Encounter: Payer: Self-pay | Admitting: Cardiology

## 2019-04-24 ENCOUNTER — Other Ambulatory Visit: Payer: Self-pay

## 2019-04-24 VITALS — BP 129/93 | HR 106 | Ht 64.5 in | Wt 200.0 lb

## 2019-04-24 DIAGNOSIS — R001 Bradycardia, unspecified: Secondary | ICD-10-CM

## 2019-04-24 DIAGNOSIS — R000 Tachycardia, unspecified: Secondary | ICD-10-CM | POA: Diagnosis not present

## 2019-04-24 DIAGNOSIS — R519 Headache, unspecified: Secondary | ICD-10-CM

## 2019-04-24 DIAGNOSIS — R55 Syncope and collapse: Secondary | ICD-10-CM

## 2019-04-24 DIAGNOSIS — R51 Headache: Secondary | ICD-10-CM | POA: Diagnosis not present

## 2019-04-24 NOTE — Progress Notes (Signed)
Primary Physician:  Patient, No Pcp Per   Patient ID: Cassandra Obrien, female    DOB: August 17, 2001, 18 y.o.   MRN: [id number]  Subjective:    Chief Complaint  Patient presents with   Chest Pain    pt c/o low bp, dizziness   Bradycardia    HPI: Cassandra Obrien  is a 18 y.o. female  with inappropriate sinus tachycardia, PAC's, bipolar disorder last evaluated by us in Jan 2020.  She was noted to have PAC's, sinus arrhythmia, and long RP tachycardia likely suggestive of sinus tachycardia on Ziopatch monitor in Jan 2020. She had had worsening episodes of heart racing along with irregular heart beat sometimes separate form her heart racing. She had improvement in symptoms with starting Acebutolol.   She was last seen 3 months ago and was doing well except for potentially having COVID. She recently called our office requesting appointment for chest pain and low heart rate.  She has recently over the last month noticed episodes where she feels as though she may pass out and feels incapacitated. She states that her heart rate drops and she also feels that her blood pressure is low at that time. Has associated chest pain and shortness of breath. She has since stopped the Acebutolol. States that duration of the episodes are worsening to now 45 mins approximately and occurring more frequently. She has been working to lose weight and has lost about 20 lbs with diet changes and some exercise.   She has history of migraines but was never formerly diagnosed. She states prior to these episodes occurring, she began having episodes where she would get a headache and lay down. She would later wake up a few hours later without recollection of the headache.  Patient does not menstrate due to IUD's; however, prior to having IUD in place she had irregular, painful, heavy cycles. No hyperglycemia.  Past Medical History:  Diagnosis Date   ADHD (attention deficit hyperactivity disorder)    Bipolar 2 disorder  (HCC)    Depression    Suicidal ideations     Past Surgical History:  Procedure Laterality Date   LAPAROSCOPIC APPENDECTOMY N/A 02/14/2014   Procedure: APPENDECTOMY LAPAROSCOPIC;  Surgeon: Judie PetitM. Leonia CoronaShuaib Farooqui, MD;  Location: [facility] OR;  Service: Pediatrics;  Laterality: N/A;    Social History   Socioeconomic History   Marital status: Single    Spouse name: n/a   Number of children: 0   Years of education: Not on file   Highest education level: Not on file  Occupational History   Occupation: Magazine features editorstudent  Social Needs   Financial resource strain: Not on file   Food insecurity    Worry: Not on file    Inability: Not on file   Transportation needs    Medical: Not on file    Non-medical: Not on file  Tobacco Use   Smoking status: Never Smoker   Smokeless tobacco: Never Used  Substance and Sexual Activity   Alcohol use: No    Alcohol/week: 0.0 standard drinks   Drug use: No   Sexual activity: Yes    Birth control/protection: I.U.D.  Lifestyle   Physical activity    Days per week: Not on file    Minutes per session: Not on file   Stress: Not on file  Relationships   Social connections    Talks on phone: Not on file    Gets together: Not on file    Attends religious service: Not on file  Active member of club or organization: Not on file    Attends meetings of clubs or organizations: Not on file    Relationship status: Not on file   Intimate partner violence    Fear of current or ex partner: Not on file    Emotionally abused: Not on file    Physically abused: Not on file    Forced sexual activity: Not on file  Other Topics Concern   Not on file  Social History Narrative   Lives with her maternal grandparents and her mother in [place], [place]).   Her younger half-siblings live with her father and step-mother in [place], [place].   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    Review of Systems  Constitution: Positive for weight loss (intentional, 20 lbs). Negative for decreased appetite, fever, malaise/fatigue and weight gain.  HENT: Negative for sore throat.   Eyes: Negative for visual disturbance.  Cardiovascular: Positive for chest pain, palpitations and syncope. Negative for claudication, dyspnea on exertion, leg swelling and orthopnea.  Respiratory: Positive for shortness of breath. Negative for hemoptysis and wheezing.   Endocrine: Negative for cold intolerance and heat intolerance.  Hematologic/Lymphatic: Does not bruise/bleed easily.  Skin: Negative for nail changes.  Musculoskeletal: Negative for muscle weakness and myalgias.  Gastrointestinal: Negative for abdominal pain, change in bowel habit, nausea and vomiting.  Neurological: Positive for headaches. Negative for difficulty with concentration, dizziness and focal weakness.  Psychiatric/Behavioral: Negative for altered mental status and suicidal ideas.  All other systems reviewed and are negative.     Objective:  Blood pressure (!) 129/93, pulse (!) 106, height 5' 4.5" (1.638 m), weight 200 lb (90.7 kg), SpO2 98 %. Body mass index is 33.8 kg/m.    Physical Exam  Constitutional: She is oriented to person, place, and time. Vital signs are normal. She appears well-developed and well-nourished.  Well built and moderately obese  HENT:  Head: Normocephalic and atraumatic.  Neck: Normal range of motion.  Cardiovascular: Normal rate, regular rhythm, normal heart sounds and intact distal pulses.  Pulmonary/Chest: Effort normal and breath sounds normal. No accessory muscle usage. No respiratory distress.  Abdominal: Soft. Bowel sounds are normal.  Musculoskeletal: Normal range of motion.  Neurological: She is alert and oriented to person, place, and time.  Skin: Skin is warm and dry.  Vitals reviewed.  Radiology: No results found.  Laboratory  examination:    CMP Latest Ref Rng & Units 08/02/2018 05/31/2018 03/30/2017  Glucose 65 - 99 mg/dL 77 83 72  BUN 6 - 20 mg/dL 7 8 10   Creatinine 0.57 - 1.00 mg/dL 0.64 0.68 0.54(L)  Sodium 134 - 144 mmol/L 140 140 140  Potassium 3.5 - 5.2 mmol/L 4.1 4.8 4.4  Chloride 96 - 106 mmol/L 101 101 102  CO2 20 - 29 mmol/L 22 25 22   Calcium 8.7 - 10.2 mg/dL 9.5 9.4 9.2  Total Protein 6.0 - 8.5 g/dL 7.5 - 7.2  Total Bilirubin 0.0 - 1.2 mg/dL 0.3 - <0.2  Alkaline Phos 39 - 117 IU/L 100 - 66  AST 0 - 40 IU/L 14 - 12  ALT 0 - 32 IU/L 17 - 15   CBC Latest Ref Rng & Units 09/11/2018 08/30/2018 08/02/2018  WBC 3.4 - 10.8 x10E3/uL 9.4 12.0(A) 10.7  Hemoglobin 11.1 - 15.9 g/dL 14.8 15.0(A) 14.3  Hematocrit 34.0 - 46.6 %  41.7 43.5(A) 42.6  Platelets 150 - 450 x10E3/uL 319 - 395   Lipid Panel     Component Value Date/Time   CHOL 130 03/30/2017 1408   TRIG 165 (H) 03/30/2017 1408   HDL 32 (L) 03/30/2017 1408   CHOLHDL 4.1 03/30/2017 1408   LDLCALC 65 03/30/2017 1408   HEMOGLOBIN A1C Lab Results  Component Value Date   HGBA1C 4.9 09/11/2018   TSH Recent Labs    05/31/18 1121 08/02/18 1703  TSH 1.020 1.330    PRN Meds:. There are no discontinued medications. Current Meds  Medication Sig   levonorgestrel (MIRENA, 52 MG,) 20 MCG/24HR IUD by Intrauterine route.   lithium carbonate (LITHOBID) 300 MG CR tablet TAKE 2 TABLETS BY MOUTH AT BEDTIME FOR 4 DAYS, THEN 3 TABLETS BY MOUTH AT BEDTIME    Cardiac Studies:   3 day zio patch from PCP office 11/09/2018: Sinus rhythm Rare premature atrial contractions Long RP tachycardia, likely represents sinus tachycardia  Treadmill stress test 09/05/18: Patient completed 5-6 minutes Bruce protocol reaching 91% predicted target heart rate at a workload of 7 METS. Non limiting chest pain with exercise. No ischemic EKG changes or arrhythmias at this moderate exercise workload. Moderate risk treadmill EKG at a moderate exercise  workload.  Assessment:     ICD-10-CM   1. Inappropriate sinus tachycardia  R00.0 EKG 12-Lead  2. Near syncope  R55   3. Bradycardia  R00.1     EKG 04/24/2019: Normal sinus rhythm at 78 bpm, normal axis, no evidence of ischemia. Normal EKG   Recommendations:   Patient with recent episodes of near syncope associated with bradycardia and hypotension. Patient had previously had inappropriate sinus tachycardia with intermittent heart racing that improved with Acebutolol. She has since stopped Acebutolol due to her episodes and has not had any recurrence of heart racing like before. Suspect that this has improved with recent weight loss, congratulated her on this and provided positive reinforcement. I will place her on 2 week event monitor to further evaluate these episodes of near syncope that she has noted with bradycardia and presumably hypotension. States that her body will feel heavy, but her head will feel like it is in space and feels incapacitated. She is not noted to be hypotensive today. No orthostatic hypotension. Symptoms are not associated with position changes, can happen at varying times. Will obtain echocardiogram to exclude any structural abnormalities. Physical exam and EKG are unremarkable.   Patient has questionable history of migraines, that were not formerly diagnosed and is also having episodes of headache without recollection of events afterwards. Question if her episodes are related to migraines. She may benefit from neurology evaluation if cardiac workup is unyielding. I will see her back in approximately 6 weeks to discuss echo and event monitor.   Toniann FailAshton Haynes Briley Bumgarner, MSN, APRN, FNP-C [facility] Office: [phone] Fax: [phone]

## 2019-04-25 ENCOUNTER — Encounter: Payer: Self-pay | Admitting: Cardiology

## 2019-04-29 ENCOUNTER — Telehealth: Payer: Self-pay

## 2019-04-29 NOTE — Telephone Encounter (Signed)
Does she have a neurologist? She needs to contact them to let them know about her seizures.

## 2019-04-29 NOTE — Telephone Encounter (Signed)
Hey I spoke with pt and she wants the referral for the neuro if you can start this for me?

## 2019-04-29 NOTE — Telephone Encounter (Signed)
Pt called she states since the last time she has been in the office she has had more episodes of seizures (frontal lobe) she says she is fully aware when it happens and she has had some weakness; She thinks the bp issues are because of the seizures. Pt says she has had 3-4 seizures just yesterday only  [phone]

## 2019-04-29 NOTE — Telephone Encounter (Signed)
I have sent referral to [facility] for stat appt

## 2019-05-02 ENCOUNTER — Ambulatory Visit (INDEPENDENT_AMBULATORY_CARE_PROVIDER_SITE_OTHER): Payer: Managed Care, Other (non HMO) | Admitting: Neurology

## 2019-05-02 ENCOUNTER — Encounter: Payer: Self-pay | Admitting: Neurology

## 2019-05-02 ENCOUNTER — Other Ambulatory Visit: Payer: Self-pay

## 2019-05-02 VITALS — BP 115/86 | HR 77 | Temp 98.4°F | Ht 64.0 in | Wt 198.3 lb

## 2019-05-02 DIAGNOSIS — R569 Unspecified convulsions: Secondary | ICD-10-CM

## 2019-05-02 NOTE — Progress Notes (Signed)
Reason for visit: Seizure-like events  Referring physician: Dr. Remonia Richter is a 18 y.o. female  History of present illness:  Cassandra Obrien is a 18 year old right-handed white female who currently works as an Public relations account executive.  The patient was seen in the emergency room on 26 April 2019 with an event that occurred while at work, she was surrounded by other coworkers who are also EMTs.  She claims that she had a sensation of dizziness and then had a "phase out" where she does not remember a lot of what happened.  She apparently has some jerking of the right arm, she was not diaphoretic.  She did experience a sensation of nausea however.  Her friends helped her to sit down, and within several minutes she began feeling more normal.  She decided to get up and go to the bathroom but when she stood up, she had recurrence of her symptoms again with some twitching of the right arm.  The entire event resolved after several more minutes, she did not have a full blackout.  She went to the emergency room, blood work was done but no other neurologic evaluation was undertaken.  The patient has been seen through cardiology, she has a cardiac monitor on currently.  She is referred to this office for further evaluation.  She claims that since 26 April 2019, she is now having 2-3 such episodes daily.  All the episodes are associated with right-sided twitching or jerking but now they are also involving the right leg and now may be associated with sitting, standing, or lying down.  The patient does not bite her tongue or lose control of the bowels or the bladder.  She denies any numbness or weakness of the face, arms, legs with exception that at times she feels somewhat numb in the feet during the episodes.  The patient feels normal between the attacks.  She does have a family history of seizures with the grandmother.  She has never had an overt blackout event with these episodes.  She is sent to this office for an evaluation.   Past Medical History:  Diagnosis Date  . ADHD (attention deficit hyperactivity disorder)   . Bipolar 2 disorder ([place])   . Depression   . Suicidal ideations     Past Surgical History:  Procedure Laterality Date  . LAPAROSCOPIC APPENDECTOMY N/A 02/14/2014   Procedure: APPENDECTOMY LAPAROSCOPIC;  Surgeon: Jerilynn Mages. Gerald Stabs, MD;  Location: [facility];  Service: Pediatrics;  Laterality: N/A;    Family History  Problem Relation Age of Onset  . Bipolar disorder Mother   . ADD / ADHD Father   . Diabetes Maternal Grandmother   . Epilepsy Maternal Grandmother   . Heart disease Maternal Grandfather     Social history:  reports that she has never smoked. She has never used smokeless tobacco. She reports that she does not drink alcohol or use drugs.  Medications:  Prior to Admission medications   Medication Sig Start Date End Date Taking? Authorizing Provider  levonorgestrel (MIRENA, 52 MG,) 20 MCG/24HR IUD by Intrauterine route. 06/18/17   [provider]  lithium carbonate (LITHOBID) 300 MG CR tablet TAKE 2 TABLETS BY MOUTH AT BEDTIME FOR 4 DAYS, THEN 3 TABLETS BY MOUTH AT BEDTIME 04/08/18   [provider]      Allergies  Allergen Reactions  . Prednisone Other (See Comments)    Trouble walking     ROS:  Out of a complete 14 system review of  symptoms, the patient complains only of the following symptoms, and all other reviewed systems are negative.  Episodic jerking, dizziness   Blood pressure 115/86, pulse 77, temperature 98.4 F (36.9 C), temperature source Temporal, height 5\' 4"  (1.626 m), weight 198 lb 5 oz (90 kg).  Physical Exam  General: The patient is alert and cooperative at the time of the examination.  The patient is moderately obese.  Eyes: Pupils are equal, round, and reactive to light. Discs are flat bilaterally.  Neck: The neck is supple, no carotid bruits are noted.  Respiratory: The respiratory examination is clear.  Cardiovascular: The  cardiovascular examination reveals a regular rate and rhythm, no obvious murmurs or rubs are noted.  Skin: Extremities are without significant edema.  Neurologic Exam  Mental status: The patient is alert and oriented x 3 at the time of the examination. The patient has apparent normal recent and remote memory, with an apparently normal attention span and concentration ability.  Cranial nerves: Facial symmetry is present. There is good sensation of the face to pinprick and soft touch bilaterally. The strength of the facial muscles and the muscles to head turning and shoulder shrug are normal bilaterally. Speech is well enunciated, no aphasia or dysarthria is noted. Extraocular movements are full. Visual fields are full. The tongue is midline, and the patient has symmetric elevation of the soft palate. No obvious hearing deficits are noted.  Motor: The motor testing reveals 5 over 5 strength of all 4 extremities. Good symmetric motor tone is noted throughout.  Sensory: Sensory testing is intact to pinprick, soft touch, vibration sensation, and position sense on all 4 extremities. No evidence of extinction is noted.  Coordination: Cerebellar testing reveals good finger-nose-finger and heel-to-shin bilaterally.  Gait and station: Gait is normal. Tandem gait is normal. Romberg is negative. No drift is seen.  Reflexes: Deep tendon reflexes are symmetric and normal bilaterally. Toes are downgoing bilaterally.   Assessment/Plan:  1.  Seizure-like episodes  The original event on 26 April 2019 could be consistent with a vasovagal syncopal type event.  The patient became dizzy, she had some jerking of the right arm, but when she sat down she felt much better fairly quickly but when she tried to stand up again the episode recurred.  She did have some nausea with the event.  Since that time, the patient has had a flurry of events occurring 2 or 3 times a day.  This type of history oftentimes is associated  with behavior type events rather than organic events such as seizures or cardiac rhythm abnormalities.  The patient does have a cardiac monitor in place, we will pursue a further evaluation to include MRI of the brain and an EEG study.  The patient currently is not operating motor vehicle.  We will see the patient back in 6 weeks.  Cassandra Obrien. Keith Willis MD 05/02/2019 4:09 PM  [facility] [address] [place], [place] [id number]  Phone [phone] Fax [phone]

## 2019-05-07 ENCOUNTER — Telehealth: Payer: Self-pay | Admitting: Neurology

## 2019-05-07 NOTE — Telephone Encounter (Signed)
LVM for pt to call back about scheduling mri  [employer] auth: NPR Ref # [id number] & UHC Auth: [place] via uhc website

## 2019-05-09 NOTE — Telephone Encounter (Signed)
I spoke to the patient she is scheduled for 05/15/19 at [facility]. No to the covid questions.

## 2019-05-13 ENCOUNTER — Telehealth: Payer: Self-pay | Admitting: Neurology

## 2019-05-13 ENCOUNTER — Other Ambulatory Visit: Payer: Self-pay

## 2019-05-13 ENCOUNTER — Ambulatory Visit (INDEPENDENT_AMBULATORY_CARE_PROVIDER_SITE_OTHER): Payer: Managed Care, Other (non HMO) | Admitting: Neurology

## 2019-05-13 DIAGNOSIS — R569 Unspecified convulsions: Secondary | ICD-10-CM | POA: Diagnosis not present

## 2019-05-13 NOTE — Procedures (Signed)
    History:  Cassandra Obrien is a 18 year old patient with a history of episodes of jerking of the right arm with slight alteration of consciousness, with no true full syncope.  The patient had her first episode on 10 July when surrounded by coworkers, now she is having 2-3 such episodes daily.  The patient has been evaluated for these events.  This is a routine EEG.  No skull defects are noted.  Medications include lithium and Mirena.  EEG classification: Normal awake  Description of the recording: The background rhythms of this recording consists of a fairly well modulated medium amplitude alpha rhythm of 9 Hz that is reactive to eye opening and closure. As the record progresses, the patient appears to remain in the waking state throughout the recording. Photic stimulation was performed, resulting in a bilateral and symmetric photic driving response. Hyperventilation was also performed, resulting in a minimal buildup of the background rhythm activities without significant slowing seen. At no time during the recording does there appear to be evidence of spike or spike wave discharges or evidence of focal slowing. EKG monitor shows no evidence of cardiac rhythm abnormalities with a heart rate of 66.  Impression: This is a normal EEG recording in the waking state. No evidence of ictal or interictal discharges are seen.

## 2019-05-13 NOTE — Telephone Encounter (Signed)
I called the patient.  EEG study was normal, MRI of the brain will be done in 2 days.  I will call her when I get that result.

## 2019-05-14 ENCOUNTER — Ambulatory Visit (INDEPENDENT_AMBULATORY_CARE_PROVIDER_SITE_OTHER): Payer: Managed Care, Other (non HMO)

## 2019-05-14 ENCOUNTER — Other Ambulatory Visit: Payer: Self-pay

## 2019-05-14 DIAGNOSIS — R55 Syncope and collapse: Secondary | ICD-10-CM

## 2019-05-14 DIAGNOSIS — R000 Tachycardia, unspecified: Secondary | ICD-10-CM

## 2019-05-15 ENCOUNTER — Other Ambulatory Visit: Payer: Self-pay

## 2019-05-15 ENCOUNTER — Ambulatory Visit (INDEPENDENT_AMBULATORY_CARE_PROVIDER_SITE_OTHER): Payer: Managed Care, Other (non HMO)

## 2019-05-15 ENCOUNTER — Other Ambulatory Visit: Payer: Self-pay | Admitting: Neurology

## 2019-05-15 DIAGNOSIS — R569 Unspecified convulsions: Secondary | ICD-10-CM

## 2019-05-15 MED ORDER — GADOBENATE DIMEGLUMINE 529 MG/ML IV SOLN: 18.0000 mL | Freq: Once | INTRAVENOUS | Status: DC | PRN

## 2019-05-16 ENCOUNTER — Telehealth: Payer: Self-pay | Admitting: Neurology

## 2019-05-16 NOTE — Telephone Encounter (Signed)
I called the patient.  The MRI of the brain is completely normal.  The patient is continuing her cardiac work-up, I strongly suspect that the seizure type events are psychogenic in nature.    MRI brain 05/16/19:  IMPRESSION:   Normal MRI brain (without).

## 2019-05-22 ENCOUNTER — Encounter: Payer: Self-pay | Admitting: Cardiology

## 2019-05-22 ENCOUNTER — Other Ambulatory Visit: Payer: Self-pay

## 2019-05-22 ENCOUNTER — Ambulatory Visit (INDEPENDENT_AMBULATORY_CARE_PROVIDER_SITE_OTHER): Payer: Managed Care, Other (non HMO) | Admitting: Cardiology

## 2019-05-22 VITALS — BP 130/82 | HR 73 | Ht 64.0 in | Wt 199.0 lb

## 2019-05-22 DIAGNOSIS — R000 Tachycardia, unspecified: Secondary | ICD-10-CM | POA: Diagnosis not present

## 2019-05-22 DIAGNOSIS — E669 Obesity, unspecified: Secondary | ICD-10-CM | POA: Diagnosis not present

## 2019-05-22 DIAGNOSIS — R55 Syncope and collapse: Secondary | ICD-10-CM

## 2019-05-22 NOTE — Progress Notes (Signed)
Primary Physician:  Patient, No Pcp Per   Patient ID: Cassandra MoulderMolly Obrien, female    DOB: 06/14/2001, 18 y.o.   MRN: [id number]  Subjective:    Chief Complaint  Patient presents with   Loss of Consciousness    4 week f/u, echo     HPI: Cassandra MoulderMolly Obrien  is a 18 y.o. female  with inappropriate sinus tachycardia, PAC's, bipolar disorder last evaluated by us in Jan 2020.  She was noted to have PAC's, sinus arrhythmia, and long RP tachycardia likely suggestive of sinus tachycardia on Ziopatch monitor in Jan 2020. She had had worsening episodes of heart racing along with irregular heart beat sometimes separate form her heart racing. She had improvement in symptoms with starting Acebutolol.   Recently seen for acute visit for chest pain, near syncope, and low heart rate. She was placed on event monitor and underwent echocardiogram and now presents for follow up.  During the interim, she reported episodes of "seizure like activity" and was urgently evaluated by Dr. Anne HahnWillis. Underwent MRI of brain and EEG that were both normal. He felt episodes related to psychogenic syncope.   She reports her last episode was yesterday, she had an episode where she feels sudden weakness, feel that her blood pressure is low, heart rate pounding, but not always fast. Episodes can happen both without relation to her activity or resting. She checks her blood pressure during these episodes and can notice that her systolic and diastolic are close together and her mom, who is a paramedic, cannot even feel her blood pressure. Also reports episodes that her blood pressure will be high. Feels that her tachycardia episodes are a lot better since weight loss. She was initially trying to lose weight; however, recently she has been having unexplained weight loss. She is very frustrated about her symptoms and feels that she is not able to safely do her job because she never knows when she will have an episode.  She has history of  migraines but was never formerly diagnosed. She states prior to these episodes occurring, she began having episodes where she would get a headache and lay down. She would later wake up a few hours later without recollection of the headache.  Patient does not menstrate due to IUD's; however, prior to having IUD in place she had irregular, painful, heavy cycles. No hyperglycemia.  Past Medical History:  Diagnosis Date   ADHD (attention deficit hyperactivity disorder)    Bipolar 2 disorder ([facility])    Depression    Suicidal ideations     Past Surgical History:  Procedure Laterality Date   LAPAROSCOPIC APPENDECTOMY N/A 02/14/2014   Procedure: APPENDECTOMY LAPAROSCOPIC;  Surgeon: Judie PetitM. Leonia CoronaShuaib Farooqui, MD;  Location: [facility];  Service: Pediatrics;  Laterality: N/A;    Social History   Socioeconomic History   Marital status: Single    Spouse name: n/a   Number of children: 0   Years of education: Not on file   Highest education level: Not on file  Occupational History   Occupation: Magazine features editorstudent  Social Needs   Financial resource strain: Not on file   Food insecurity    Worry: Not on file    Inability: Not on file   Transportation needs    Medical: Not on file    Non-medical: Not on file  Tobacco Use   Smoking status: Never Smoker   Smokeless tobacco: Never Used  Substance and Sexual Activity   Alcohol use: No    Alcohol/week: 0.0 standard  drinks   Drug use: No   Sexual activity: Yes    Birth control/protection: I.U.D.  Lifestyle   Physical activity    Days per week: Not on file    Minutes per session: Not on file   Stress: Not on file  Relationships   Social connections    Talks on phone: Not on file    Gets together: Not on file    Attends religious service: Not on file    Active member of club or organization: Not on file    Attends meetings of clubs or organizations: Not on file    Relationship status: Not on file   Intimate partner violence    Fear of  current or ex partner: Not on file    Emotionally abused: Not on file    Physically abused: Not on file    Forced sexual activity: Not on file  Other Topics Concern   Not on file  Social History Narrative   Lives with her maternal grandparents and her mother in [place], [place]   Her younger half-siblings live with her father and step-mother in [place], [place].   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   Caffeine~ 2 or 3 cups   Right handed     Review of Systems  Constitution: Positive for weight loss (intentional, 20 lbs). Negative for decreased appetite, fever, malaise/fatigue and weight gain.  HENT: Negative for sore throat.   Eyes: Negative for visual disturbance.  Cardiovascular: Positive for chest pain, palpitations and syncope. Negative for claudication, dyspnea on exertion, leg swelling and orthopnea.  Respiratory: Positive for shortness of breath. Negative for hemoptysis and wheezing.   Endocrine: Negative for cold intolerance and heat intolerance.  Hematologic/Lymphatic: Does not bruise/bleed easily.  Skin: Negative for nail changes.  Musculoskeletal: Negative for muscle weakness and myalgias.  Gastrointestinal: Negative for abdominal pain, change in bowel habit, nausea and vomiting.  Neurological: Positive for headaches. Negative for difficulty with concentration, dizziness and focal weakness.  Psychiatric/Behavioral: Negative for altered mental status and suicidal ideas.  All other systems reviewed and are negative.     Objective:  Blood pressure 130/82, pulse 73, height 5\' 4"  (1.626 m), weight 199 lb (90.3 kg), SpO2 98 %. Body mass index is 34.16 kg/m.    Physical Exam  Constitutional: She is oriented to person, place, and time. Vital signs are normal. She appears well-developed and well-nourished.  Well built and moderately obese  HENT:  Head:  Normocephalic and atraumatic.  Neck: Normal range of motion.  Cardiovascular: Normal rate, regular rhythm, normal heart sounds and intact distal pulses.  Pulmonary/Chest: Effort normal and breath sounds normal. No accessory muscle usage. No respiratory distress.  Abdominal: Soft. Bowel sounds are normal.  Musculoskeletal: Normal range of motion.  Neurological: She is alert and oriented to person, place, and time.  Skin: Skin is warm and dry.  Vitals reviewed.  Radiology: No results found.  Laboratory examination:    CMP Latest Ref Rng & Units 08/02/2018 05/31/2018 03/30/2017  Glucose 65 - 99 mg/dL 77 83 72  BUN 6 - 20 mg/dL 7 8 10   Creatinine 0.57 - 1.00 mg/dL 0.64 0.68 0.54(L)  Sodium 134 - 144 mmol/L 140 140 140  Potassium 3.5 - 5.2 mmol/L 4.1 4.8 4.4  Chloride 96 - 106 mmol/L 101 101 102  CO2 20 - 29 mmol/L 22 25  22  Calcium 8.7 - 10.2 mg/dL 9.5 9.4 9.2  Total Protein 6.0 - 8.5 g/dL 7.5 - 7.2  Total Bilirubin 0.0 - 1.2 mg/dL 0.3 - <1.6<0.2  Alkaline Phos 39 - 117 IU/L 100 - 66  AST 0 - 40 IU/L 14 - 12  ALT 0 - 32 IU/L 17 - 15   CBC Latest Ref Rng & Units 09/11/2018 08/30/2018 08/02/2018  WBC 3.4 - 10.8 x10E3/uL 9.4 12.0(A) 10.7  Hemoglobin 11.1 - 15.9 g/dL 10.914.8 15.0(A) 14.3  Hematocrit 34.0 - 46.6 % 41.7 43.5(A) 42.6  Platelets 150 - 450 x10E3/uL 319 - 395   Lipid Panel     Component Value Date/Time   CHOL 130 03/30/2017 1408   TRIG 165 (H) 03/30/2017 1408   HDL 32 (L) 03/30/2017 1408   CHOLHDL 4.1 03/30/2017 1408   LDLCALC 65 03/30/2017 1408   HEMOGLOBIN A1C Lab Results  Component Value Date   HGBA1C 4.9 09/11/2018   TSH Recent Labs    05/31/18 1121 08/02/18 1703  TSH 1.020 1.330    PRN Meds:. There are no discontinued medications. Current Meds  Medication Sig   levonorgestrel (MIRENA, 52 MG,) 20 MCG/24HR IUD by Intrauterine route.   lithium carbonate (LITHOBID) 300 MG CR tablet TAKE 2 TABLETS BY MOUTH AT BEDTIME FOR 4 DAYS, THEN 3 TABLETS BY MOUTH AT  BEDTIME    Cardiac Studies:   14-day event monitor 7/8-7/21/2020: Dominant rhythm normal sinus.  11 patient triggered events occurred for lightheadedness, dizziness, chest pain, fatigue, rapid/fast heart rate that correlated with episodes of sinus arrhythmia, sinus rhythm, and sinus tachycardia.  Maximum heart rate 168 bpm on 7/8 11:21 PM with reported symptoms of lightheaded/dizzy.  Minimum heart rate 48 bpm on 7/13 at 6:44 AM.  No A. fib or SVT was noted.  Echo- 05/14/2019 1. Normal LV systolic function with EF 68%. Left ventricle cavity is normal in size. Slight asymmetric hypertrophy of the left ventricle. There is no SAM or LV gradient. Normal global wall motion. Normal diastolic function. Calculated EF 68%. 2. Left atrial cavity is mildly dilated at 4.1 cm. 3. Mild tricuspid regurgitation. No evidence of pulmonary hypertension.  3 day zio patch from PCP office 11/09/2018: Sinus rhythm Rare premature atrial contractions Long RP tachycardia, likely represents sinus tachycardia  Treadmill stress test 09/05/18: Patient completed 5-6 minutes Bruce protocol reaching 91% predicted target heart rate at a workload of 7 METS. Non limiting chest pain with exercise. No ischemic EKG changes or arrhythmias at this moderate exercise workload. Moderate risk treadmill EKG at a moderate exercise workload.  Assessment:     ICD-10-CM   1. Inappropriate sinus tachycardia  R00.0   2. Postural dizziness with presyncope  R42    R55   3. Obesity, Class II, BMI 35-39.9  E66.9     EKG 04/24/2019: Normal sinus rhythm at 78 bpm, normal axis, no evidence of ischemia. Normal EKG   Recommendations:   Patient is here to discuss test results. Reported symptoms did not correlate with arrhythmia. Do not feel that her episodes are of arrhythmic etiology. She did have occasional sinus tachycardia and has previously had some inappropriate sinus tachycardia; however, she reports that this has improved with  weight loss. Symptoms did not correlate with this. Echocardiogram was reviewed today by myself and Dr. Jacinto HalimGanji. Noted asymmetric hypertrophy, was made in error. She has essentially normal echocardiogram.   It is very difficult to explain her near syncopal episodes as her symptoms are varying from each episode  and it is difficult to differentiate. She does not appear to have warning symptoms, making vasovagal etiology less likely. Originally, felt her symptoms may have been possibly related to vasodepresser syncope; however, again her symptoms do not quite seem to match with this. She has had negative MRI of brain and EEG as well as evaluation by neurology. As I can not explain her symptoms by her cardiac testing, feel that she should continue to look for non-cardiac etiology. Neurology has suggested psychiatric evaluation for possible functional syncope, which I agree that this should at least be excluded. I will place referral for this. She is very frustrated by her symptoms and I have offered to take her out of work until she can have further evaluation; however, she wishes to continue to work. I have urged her to try Vitamin B6 and B12 vitamins to see if this well help at all. If she should feel warning symptoms, encouraged her to lay down wherever possible. I have also discussed obtaining opinion from possibly [facility] or [facility] if psychiatric evaluation is unyielding. Will discuss at her next office visit in 6 weeks.    *I have discussed this case with Dr. Jacinto HalimGanji and he personally examined the patient and participated in formulating the plan.*   Toniann FailAshton Haynes Nabeel Gladson, MSN, APRN, FNP-C [facility] Office: [phone] Fax: [phone]

## 2019-05-26 ENCOUNTER — Encounter: Payer: Self-pay | Admitting: Cardiology

## 2019-05-27 NOTE — Addendum Note (Signed)
Addended by: Gwinda Maine on: 05/27/2019 09:42 AM   Modules accepted: Orders

## 2019-06-12 ENCOUNTER — Ambulatory Visit: Payer: Self-pay | Admitting: Neurology

## 2019-07-04 ENCOUNTER — Ambulatory Visit: Payer: Managed Care, Other (non HMO) | Admitting: Cardiology

## 2019-10-13 IMAGING — DX CHEST - 2 VIEW
2 series · 2 of 2 positions shown · non-contrast
Comparison: None.

CLINICAL DATA: Pt c/o cough and fever x 3 days. States she is an
EMT and that it is possible she has had a positive Covid interaction
with a patient. cough, shortness of breath

EXAM:
CHEST - 2 VIEW

[chest pa]
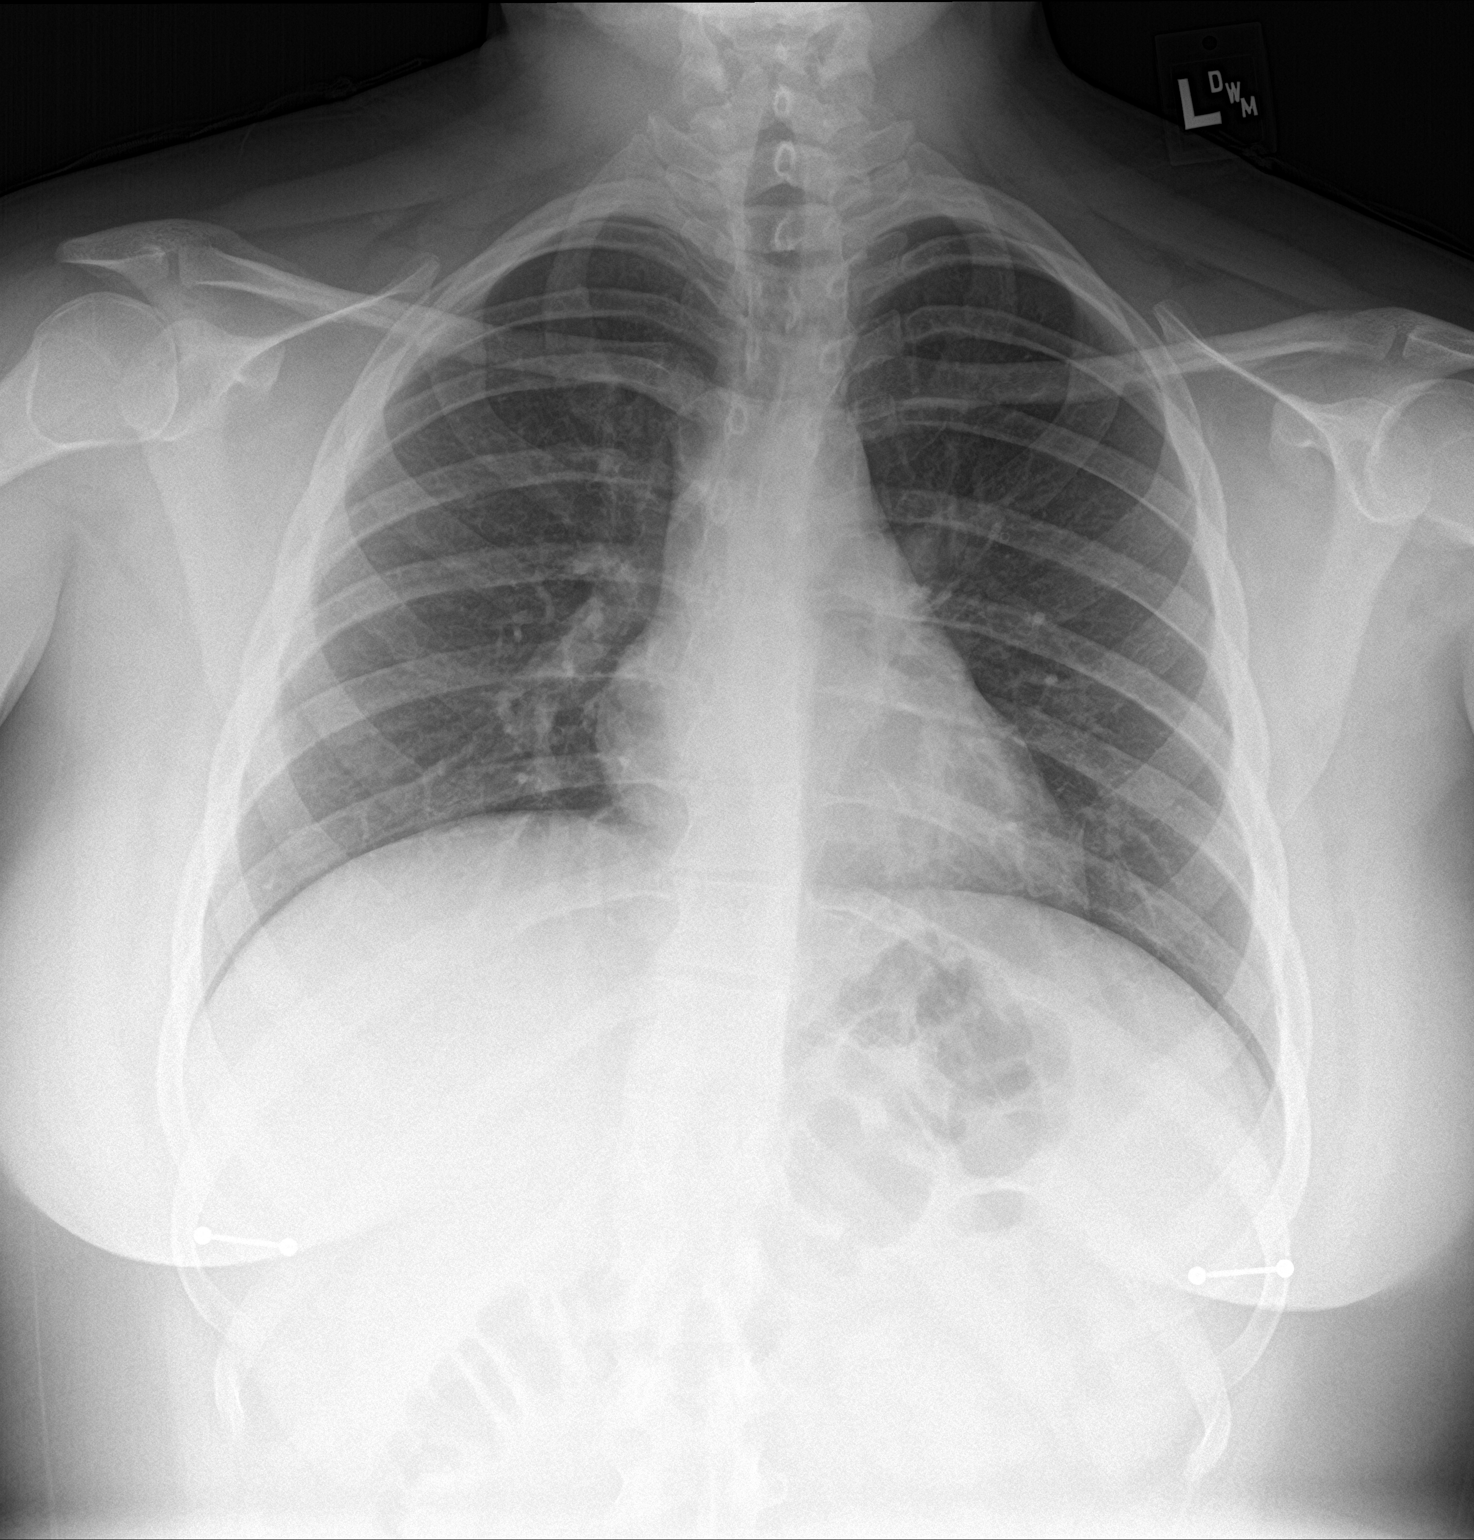

[chest lat]
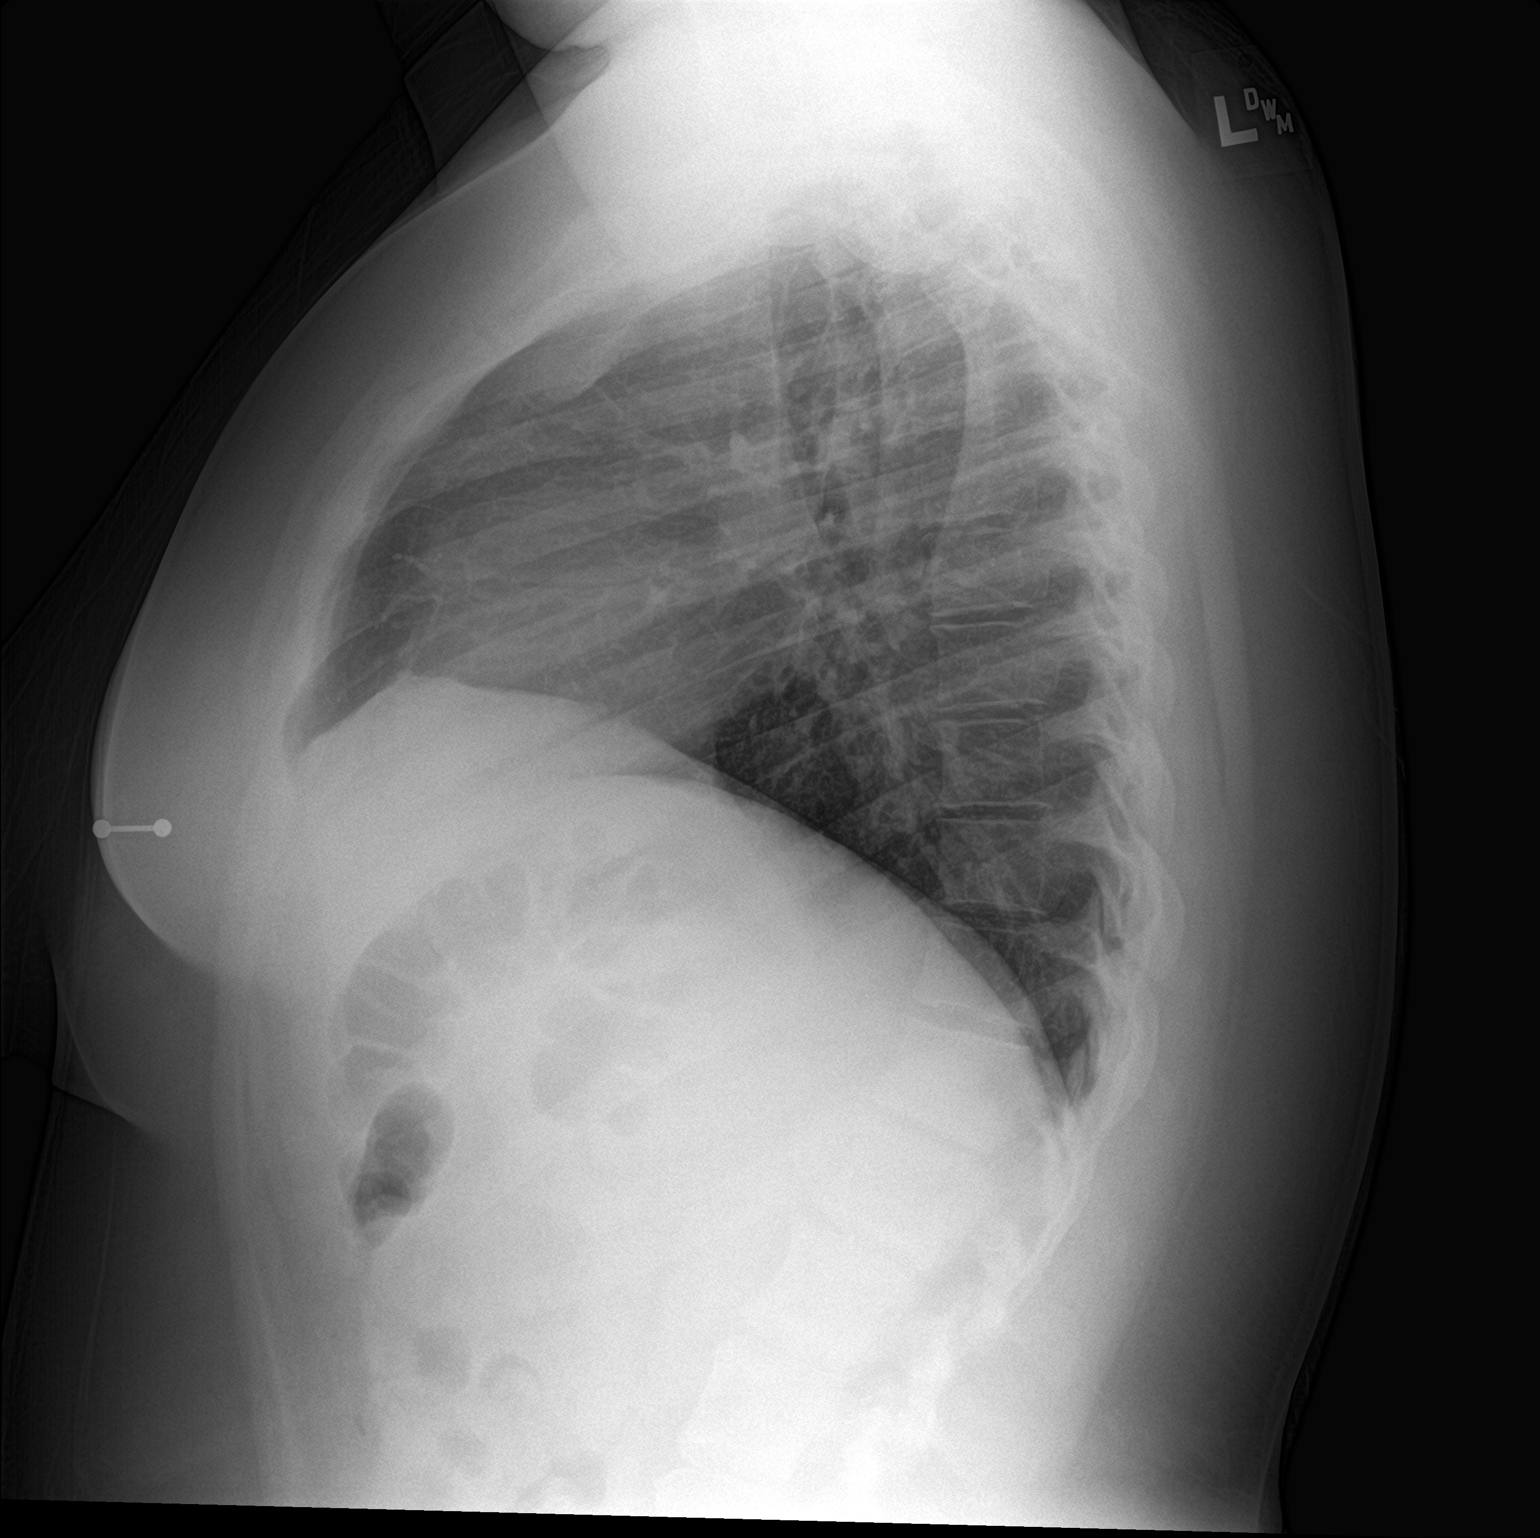

[2 of 2 positions shown; findings below may reference images not displayed]

FINDINGS: Normal mediastinum and cardiac silhouette. Normal pulmonary
vasculature. No evidence of effusion, infiltrate, or pneumothorax.
No acute bony abnormality.
IMPRESSION: Normal chest radiograph.

## 2019-11-06 ENCOUNTER — Encounter: Payer: Managed Care, Other (non HMO) | Admitting: Obstetrics & Gynecology

## 2019-12-09 ENCOUNTER — Encounter (HOSPITAL_COMMUNITY): Payer: Self-pay

## 2019-12-09 ENCOUNTER — Ambulatory Visit (HOSPITAL_COMMUNITY)
Admission: EM | Admit: 2019-12-09 | Discharge: 2019-12-09 | Disposition: A | Discharge status: Home / Self Care | Payer: Managed Care, Other (non HMO) | Attending: Family Medicine | Admitting: Family Medicine

## 2019-12-09 ENCOUNTER — Other Ambulatory Visit: Payer: Self-pay

## 2019-12-09 DIAGNOSIS — Z3201 Encounter for pregnancy test, result positive: Secondary | ICD-10-CM

## 2019-12-09 DIAGNOSIS — O210 Mild hyperemesis gravidarum: Secondary | ICD-10-CM | POA: Diagnosis not present

## 2019-12-09 MED ORDER — BONJESTA 20-20 MG PO TBCR: 1.0000 [IU] | EXTENDED_RELEASE_TABLET | Freq: Every day | ORAL | 0 refills | Status: AC

## 2019-12-09 MED ORDER — ONDANSETRON HCL 4 MG PO TABS: 4.0000 mg | ORAL_TABLET | Freq: Four times a day (QID) | ORAL | 0 refills | Status: AC

## 2019-12-09 NOTE — ED Provider Notes (Signed)
[facility]    CSN: [id number] Arrival date & time: 12/09/19  1856      History   Chief Complaint Chief Complaint  Patient presents with  . Nausea  . Headache    HPI Cassandra Obrien is a 19 y.o. female.   HPI   Patient states she is [redacted] weeks pregnant.  She states she has had some morning sickness.  It is not bad.  Today, for some reason, she has had protracted vomiting.  Been throwing up all day long.  She states that she is "dehydrated".  She is not keeping down even water. No fever or chills.  No signs of illness.  No diarrhea or abdominal pain. No vaginal bleeding.  No complication of pregnancy.   Past Medical History:  Diagnosis Date  . ADHD (attention deficit hyperactivity disorder)   . Bipolar 2 disorder (HCC)   . Depression   . Suicidal ideations     Patient Active Problem List   Diagnosis Date Noted  . Inappropriate sinus tachycardia 01/31/2019  . Premature atrial contraction 01/31/2019  . Bipolar disorder (HCC) 12/22/2014  . MDD (major depressive disorder), recurrent severe, without psychosis (HCC) 12/22/2014  . Obesity, Class II, BMI 35-39.9 11/27/2012    Past Surgical History:  Procedure Laterality Date  . LAPAROSCOPIC APPENDECTOMY N/A 02/14/2014   Procedure: APPENDECTOMY LAPAROSCOPIC;  Surgeon: Judie Petit. Leonia Corona, MD;  Location: [facility] OR;  Service: Pediatrics;  Laterality: N/A;    OB History    Gravida  2   Para  0   Term  0   Preterm  0   AB  0   Living  0     SAB  0   TAB  0   Ectopic  0   Multiple  0   Live Births  0            Home Medications    Prior to Admission medications   Medication Sig Start Date End Date Taking? Authorizing Provider  Prenatal Vit-Fe Fumarate-FA (PRENATAL PO) Take by mouth.   Yes [provider]  Doxylamine-Pyridoxine ER (BONJESTA) 20-20 MG TBCR Take 1 Units by mouth at bedtime. 12/09/19   Eustace Moore, MD  ondansetron (ZOFRAN) 4 MG tablet Take 1 tablet (4 mg total) by mouth  every 6 (six) hours. 12/09/19   Eustace Moore, MD  levonorgestrel (MIRENA, 52 MG,) 20 MCG/24HR IUD by Intrauterine route. 06/18/17 12/09/19  [provider]  lithium carbonate (LITHOBID) 300 MG CR tablet TAKE 2 TABLETS BY MOUTH AT BEDTIME FOR 4 DAYS, THEN 3 TABLETS BY MOUTH AT BEDTIME 04/08/18 12/09/19  [provider]    Family History Family History  Problem Relation Age of Onset  . Bipolar disorder Mother   . ADD / ADHD Father   . Diabetes Maternal Grandmother   . Epilepsy Maternal Grandmother   . Heart disease Maternal Grandfather     Social History Social History   Tobacco Use  . Smoking status: Never Smoker  . Smokeless tobacco: Never Used  Substance Use Topics  . Alcohol use: No    Alcohol/week: 0.0 standard drinks  . Drug use: No     Allergies   Prednisone   Review of Systems Review of Systems  Constitutional: Positive for appetite change. Negative for activity change, chills, fatigue and fever.  HENT: Negative for congestion.   Respiratory: Negative for cough and shortness of breath.   Gastrointestinal: Positive for nausea and vomiting. Negative for abdominal pain and  diarrhea.  Neurological: Positive for headaches.     Physical Exam Triage Vital Signs ED Triage Vitals  Enc Vitals Group     BP 12/09/19 1929 130/85     Pulse Rate 12/09/19 1929 99     Resp 12/09/19 1929 17     Temp 12/09/19 1929 98.6 F (37 C)     Temp Source 12/09/19 1929 Oral     SpO2 12/09/19 1929 100 %     Weight --      Height --      Head Circumference --      Peak Flow --      Pain Score 12/09/19 1927 6     Pain Loc --      Pain Edu? --      Excl. in [place]? --    No data found.  Updated Vital Signs BP 130/85 (BP Location: Right Arm)   Pulse 99   Temp 98.6 F (37 C) (Oral)   Resp 17   SpO2 100%       Physical Exam Constitutional:      General: She is not in acute distress.    Appearance: She is well-developed.     Comments: Patient is mildly  overweight.  No acute distress.  HENT:     Head: Normocephalic and atraumatic.     Mouth/Throat:     Comments: Mucous membranes are moist Eyes:     Conjunctiva/sclera: Conjunctivae normal.     Pupils: Pupils are equal, round, and reactive to light.  Cardiovascular:     Rate and Rhythm: Normal rate and regular rhythm.     Heart sounds: Normal heart sounds.  Pulmonary:     Effort: Pulmonary effort is normal. No respiratory distress.     Breath sounds: Normal breath sounds.  Musculoskeletal:        General: Normal range of motion.     Cervical back: Normal range of motion.  Skin:    General: Skin is warm and dry.  Neurological:     Mental Status: She is alert.  Psychiatric:        Mood and Affect: Mood normal.        Behavior: Behavior normal.      UC Treatments / Results  Labs (all labs ordered are listed, but only abnormal results are displayed) Labs Reviewed  POC URINE PREG, ED  POCT PREGNANCY, URINE    EKG   Radiology No results found.  Procedures Procedures (including critical care time)  Medications Ordered in UC Medications - No data to display  Initial Impression / Assessment and Plan / UC Course  I have reviewed the triage vital signs and the nursing notes.  Pertinent labs & imaging results that were available during my care of the patient were reviewed by me and considered in my medical decision making (see chart for details).     Reviewed safe medications to take during pregnancy, morning sickness, and the importance of follow-up with OB/GYN Final Clinical Impressions(s) / UC Diagnoses   Final diagnoses:  Morning sickness     Discharge Instructions     Fluids as tolerated- sips Take the doxylamine at night- this is safest May try dramamine May take zofran if nothing else works [facility] if you remain dehydrated     ED Prescriptions    Medication Sig Dispense Auth. Provider   Doxylamine-Pyridoxine ER (BONJESTA) 20-20 MG TBCR  Take 1 Units by mouth at bedtime. 10 tablet Raylene Everts, MD  ondansetron (ZOFRAN) 4 MG tablet Take 1 tablet (4 mg total) by mouth every 6 (six) hours. 10 tablet Eustace Moore, MD     PDMP not reviewed this encounter.   Eustace Moore, MD 12/09/19 2038

## 2019-12-09 NOTE — Discharge Instructions (Signed)
Fluids as tolerated- sips Take the doxylamine at night- this is safest May try dramamine May take zofran if nothing else works [facility] if you remain [facility]ated

## 2019-12-09 NOTE — ED Triage Notes (Signed)
Pt reports having a bad nausea, sweating and headache since this morning. Pt reports she is [redacted] weeks pregnant. Pt states she was sweating more than usual today.

## 2020-01-09 ENCOUNTER — Encounter: Payer: Managed Care, Other (non HMO) | Admitting: Obstetrics & Gynecology
